# Patient Record
Sex: Male | Born: 1975 | Race: Black or African American | Hispanic: No | Marital: Single | Smoking: Former smoker
Health system: Southern US, Community
[De-identification: ages and names within clinical notes are randomized; demographics above are authoritative.]

---

## 2000-10-30 ENCOUNTER — Emergency Department (HOSPITAL_COMMUNITY): Admission: EM | Admit: 2000-10-30 | Discharge: 2000-10-30 | Payer: Self-pay | Admitting: Emergency Medicine

## 2000-10-30 ENCOUNTER — Encounter: Payer: Self-pay | Admitting: Emergency Medicine

## 2003-12-30 ENCOUNTER — Emergency Department (HOSPITAL_COMMUNITY): Admission: EM | Admit: 2003-12-30 | Discharge: 2003-12-30 | Payer: Self-pay | Admitting: Emergency Medicine

## 2006-04-09 ENCOUNTER — Emergency Department (HOSPITAL_COMMUNITY): Admission: EM | Admit: 2006-04-09 | Discharge: 2006-04-09 | Payer: Self-pay | Admitting: Emergency Medicine

## 2007-08-02 ENCOUNTER — Emergency Department (HOSPITAL_COMMUNITY): Admission: EM | Admit: 2007-08-02 | Discharge: 2007-08-02 | Payer: Self-pay | Admitting: Diagnostic Radiology

## 2008-11-28 ENCOUNTER — Emergency Department (HOSPITAL_COMMUNITY): Admission: EM | Admit: 2008-11-28 | Discharge: 2008-11-28 | Payer: Self-pay | Admitting: Emergency Medicine

## 2008-12-09 ENCOUNTER — Emergency Department (HOSPITAL_BASED_OUTPATIENT_CLINIC_OR_DEPARTMENT_OTHER): Admission: EM | Admit: 2008-12-09 | Discharge: 2008-12-09 | Payer: Self-pay | Admitting: Emergency Medicine

## 2008-12-09 ENCOUNTER — Ambulatory Visit: Payer: Self-pay | Admitting: Radiology
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
## 2012-01-21 ENCOUNTER — Encounter (HOSPITAL_BASED_OUTPATIENT_CLINIC_OR_DEPARTMENT_OTHER): Payer: Self-pay | Admitting: *Deleted

## 2012-01-21 ENCOUNTER — Emergency Department (HOSPITAL_BASED_OUTPATIENT_CLINIC_OR_DEPARTMENT_OTHER)
Admission: EM | Admit: 2012-01-21 | Discharge: 2012-01-21 | Disposition: A | Discharge status: Home / Self Care | Payer: 59 | Attending: Emergency Medicine | Admitting: Emergency Medicine

## 2012-01-21 DIAGNOSIS — K5289 Other specified noninfective gastroenteritis and colitis: Secondary | ICD-10-CM | POA: Insufficient documentation

## 2012-01-21 DIAGNOSIS — R5383 Other fatigue: Secondary | ICD-10-CM | POA: Insufficient documentation

## 2012-01-21 DIAGNOSIS — K529 Noninfective gastroenteritis and colitis, unspecified: Secondary | ICD-10-CM

## 2012-01-21 DIAGNOSIS — R197 Diarrhea, unspecified: Secondary | ICD-10-CM | POA: Insufficient documentation

## 2012-01-21 DIAGNOSIS — R110 Nausea: Secondary | ICD-10-CM | POA: Insufficient documentation

## 2012-01-21 DIAGNOSIS — R5381 Other malaise: Secondary | ICD-10-CM | POA: Insufficient documentation

## 2012-01-21 DIAGNOSIS — F17200 Nicotine dependence, unspecified, uncomplicated: Secondary | ICD-10-CM | POA: Insufficient documentation

## 2012-01-21 LAB — CBC
HCT: 48 % (ref 39.0–52.0)
Hemoglobin: 16.1 g/dL (ref 13.0–17.0)
MCV: 83.7 fL (ref 78.0–100.0)
RBC: 5.02 MIL/uL (ref 4.22–5.81)
WBC: 14.4 10*3/uL — ABNORMAL HIGH (ref 4.0–10.5)

## 2012-01-21 LAB — URINALYSIS, ROUTINE W REFLEX MICROSCOPIC
Glucose, UA: NEGATIVE mg/dL
Hgb urine dipstick: NEGATIVE
Leukocytes, UA: NEGATIVE
Protein, ur: NEGATIVE mg/dL
pH: 5.5 (ref 5.0–8.0)

## 2012-01-21 LAB — DIFFERENTIAL
Eosinophils Relative: 0 % (ref 0–5)
Lymphocytes Relative: 7 % — ABNORMAL LOW (ref 12–46)
Lymphs Abs: 1 10*3/uL (ref 0.7–4.0)
Monocytes Relative: 7 % (ref 3–12)
Neutro Abs: 12.4 10*3/uL — ABNORMAL HIGH (ref 1.7–7.7)

## 2012-01-21 LAB — BASIC METABOLIC PANEL
CO2: 21 mEq/L (ref 19–32)
Calcium: 9.2 mg/dL (ref 8.4–10.5)
Chloride: 101 mEq/L (ref 96–112)
Creatinine, Ser: 1.1 mg/dL (ref 0.50–1.35)
Glucose, Bld: 108 mg/dL — ABNORMAL HIGH (ref 70–99)

## 2012-01-21 MED ORDER — METOCLOPRAMIDE HCL 10 MG PO TABS: 10.0000 mg | ORAL_TABLET | Freq: Four times a day (QID) | ORAL | Status: AC | PRN

## 2012-01-21 MED ORDER — SODIUM CHLORIDE 0.9 % IV BOLUS (SEPSIS)
1000.0000 mL | Freq: Once | INTRAVENOUS | Status: AC
  Administered 2012-01-21: 1000 mL via INTRAVENOUS

## 2012-01-21 MED ORDER — ONDANSETRON HCL 4 MG/2ML IJ SOLN
4.0000 mg | Freq: Once | INTRAMUSCULAR | Status: AC
  Administered 2012-01-21: 4 mg via INTRAVENOUS
  Filled 2012-01-21: qty 2

## 2012-01-21 MED ORDER — LOPERAMIDE HCL 2 MG PO CAPS
4.0000 mg | ORAL_CAPSULE | Freq: Once | ORAL | Status: AC
  Administered 2012-01-21: 4 mg via ORAL
  Filled 2012-01-21: qty 2

## 2012-01-21 MED ORDER — SODIUM CHLORIDE 0.9 % IV SOLN
Freq: Once | INTRAVENOUS | Status: AC
  Administered 2012-01-21: 150 mL/h via INTRAVENOUS

## 2012-01-21 NOTE — ED Notes (Signed)
Received pt. From triage with c/o diarrhea, nausea and weakness, NAD noted

## 2012-01-21 NOTE — ED Provider Notes (Signed)
History     CSN: [id number]  Arrival date & time 01/21/12  2956   First MD Initiated Contact with Patient 01/21/12 2030      Chief Complaint  Patient presents with  . Nausea    (Consider location/radiation/quality/duration/timing/severity/associated sxs/prior treatment) The history is provided by the patient.   36 year old male had onset this morning of diarrhea and nausea. He has not vomited. He has had about 5 watery bowel movements and is still has a sensation of these can have more bowel movements. He had an episode of indigestion this morning but that resolved on its own. He denies abdominal pain. He feels generally weak, but has not had fever chills or sweats. He is unaware of any sick contacts. Last night he had eaten chili dogs and hot wings. Today he is not eaten anything and has only had about 10 ounces of liquid. Symptoms are described as moderate to severe. He did try taking some Pepto-Bismol with no relief.  History reviewed. No pertinent past medical history.  History reviewed. No pertinent past surgical history.  No family history on file.  History  Substance Use Topics  . Smoking status: Current Everyday Smoker -- 1.0 packs/day  . Smokeless tobacco: Not on file  . Alcohol Use: Yes      Review of Systems  All other systems reviewed and are negative.    Allergies  Review of patient's allergies indicates no known allergies.  Home Medications   Current Outpatient Rx  Name Route Sig Dispense Refill  . PEPTO-BISMOL PO Oral Take 1 tablet by mouth daily as needed. Patient used this medication for an upset stomach.      BP 120/70  Pulse 83  Temp(Src) 99.9 F (37.7 C) (Oral)  Resp 16  Wt 220 lb (99.791 kg)  SpO2 98%  Physical Exam  Nursing note and vitals reviewed.  36 year old male who is resting comfortably and in no acute distress. Vital signs are normal. Oxygen saturation is 99% which is normal. Head is normocephalic and atraumatic. PERRLA, EOMI. Her  [place] Guest is clear mucous members are moist. Neck is nontender and supple. Back is nontender. Lungs are clear without rales, wheezes, rhonchi. Heart has regular rate rhythm without murmur. Abdomen is soft, flat, nontender without masses or hepatosplenomegaly. Peristalsis is slightly hyperactive. Extremities have no cyanosis or edema, full range of motion is present. Skin is warm and dry without rash. Neurologic: Mental status is normal, cranial nerves are intact, there no focal motor or sensory deficits.  ED Course  Procedures (including critical care time)  Results for orders placed during the hospital encounter of 01/21/12  CBC      Component Value Range   WBC 14.4 (*) 4.0 - 10.5 (K/uL)   RBC 5.02  4.22 - 5.81 (MIL/uL)   Hemoglobin 16.1  13.0 - 17.0 (g/dL)   HCT 21.3  08.6 - 57.8 (%)   MCV 83.7  78.0 - 100.0 (fL)   MCH 32.1  26.0 - 34.0 (pg)   MCHC 33.5  30.0 - 36.0 (g/dL)   RDW 46.9  62.9 - 52.8 (%)   Platelets 183  150 - 400 (K/uL)  DIFFERENTIAL      Component Value Range   Neutrophils Relative 86 (*) 43 - 77 (%)   Lymphocytes Relative 7 (*) 12 - 46 (%)   Monocytes Relative 7  3 - 12 (%)   Eosinophils Relative 0  0 - 5 (%)   Basophils Relative 0  0 - 1 (%)  Neutro Abs 12.4 (*) 1.7 - 7.7 (K/uL)   Lymphs Abs 1.0  0.7 - 4.0 (K/uL)   Monocytes Absolute 1.0  0.1 - 1.0 (K/uL)   Eosinophils Absolute 0.0  0.0 - 0.7 (K/uL)   Basophils Absolute 0.0  0.0 - 0.1 (K/uL)  BASIC METABOLIC PANEL      Component Value Range   Sodium 134 (*) 135 - 145 (mEq/L)   Potassium 3.7  3.5 - 5.1 (mEq/L)   Chloride 101  96 - 112 (mEq/L)   CO2 21  19 - 32 (mEq/L)   Glucose, Bld 108 (*) 70 - 99 (mg/dL)   BUN 15  6 - 23 (mg/dL)   Creatinine, Ser 1.61  0.50 - 1.35 (mg/dL)   Calcium 9.2  8.4 - 09.6 (mg/dL)   GFR calc non Af Amer 85 (*) >90 (mL/min)   GFR calc Af Amer >90  >90 (mL/min)  URINALYSIS, ROUTINE W REFLEX MICROSCOPIC      Component Value Range   Color, Urine YELLOW  YELLOW    APPearance CLEAR   CLEAR    Specific Gravity, Urine 1.015  1.005 - 1.030    pH 5.5  5.0 - 8.0    Glucose, UA NEGATIVE  NEGATIVE (mg/dL)   Hgb urine dipstick NEGATIVE  NEGATIVE    Bilirubin Urine NEGATIVE  NEGATIVE    Ketones, ur NEGATIVE  NEGATIVE (mg/dL)   Protein, ur NEGATIVE  NEGATIVE (mg/dL)   Urobilinogen, UA 0.2  0.0 - 1.0 (mg/dL)   Nitrite NEGATIVE  NEGATIVE    Leukocytes, UA NEGATIVE  NEGATIVE    He was given IV fluids, IV ondansetron, and oral loperamide with good relief of symptoms. He'll be sent home with a prescription for oral metoclopramide and told to take over-the-counter loperamide as needed.  1. Gastroenteritis       MDM  Nausea and diarrhea which could be due to you to poisoning and could be do to a viral infection. He'll be given IV fluids, IV Zofran and oral loperamide.        Dione Booze, MD 01/21/12 [phone]

## 2012-01-21 NOTE — ED Notes (Signed)
Pt. Discharged to home, pt. Alert and oriented, NAD noted, ambulatory gait steady

## 2012-01-21 NOTE — Discharge Instructions (Signed)
Take Imodium AD as needed for diarrhea.  Diarrhea Infections caused by germs (bacterial) or a virus commonly cause diarrhea. Your caregiver has determined that with time, rest and fluids, the diarrhea should improve. In general, eat normally while drinking more water than usual. Although water may prevent dehydration, it does not contain salt and minerals (electrolytes). Broths, weak tea without caffeine and oral rehydration solutions (ORS) replace fluids and electrolytes. Small amounts of fluids should be taken frequently. Large amounts at one time may not be tolerated. Plain water may be harmful in infants and the elderly. Oral rehydrating solutions (ORS) are available at pharmacies and grocery stores. ORS replace water and important electrolytes in proper proportions. Sports drinks are not as effective as ORS and may be harmful due to sugars worsening diarrhea.  ORS is especially recommended for use in children with diarrhea. As a general guideline for children, replace any new fluid losses from diarrhea and/or vomiting with ORS as follows:   If your child weighs 22 pounds or under (10 kg or less), give 60-120 mL ( -  cup or 2 - 4 ounces) of ORS for each episode of diarrheal stool or vomiting episode.   If your child weighs more than 22 pounds (more than 10 kgs), give 120-240 mL ( - 1 cup or 4 - 8 ounces) of ORS for each diarrheal stool or episode of vomiting.   While correcting for dehydration, children should eat normally. However, foods high in sugar should be avoided because this may worsen diarrhea. Large amounts of carbonated soft drinks, juice, gelatin desserts and other highly sugared drinks should be avoided.   After correction of dehydration, other liquids that are appealing to the child may be added. Children should drink small amounts of fluids frequently and fluids should be increased as tolerated. Children should drink enough fluids to keep urine clear or pale yellow.   Adults  should eat normally while drinking more fluids than usual. Drink small amounts of fluids frequently and increase as tolerated. Drink enough fluids to keep urine clear or pale yellow. Broths, weak decaffeinated tea, lemon lime soft drinks (allowed to go flat) and ORS replace fluids and electrolytes.   Avoid:   Carbonated drinks.   Juice.   Extremely hot or cold fluids.   Caffeine drinks.   Fatty, greasy foods.   Alcohol.   Tobacco.   Too much intake of anything at one time.   Gelatin desserts.   Probiotics are active cultures of beneficial bacteria. They may lessen the amount and number of diarrheal stools in adults. Probiotics can be found in yogurt with active cultures and in supplements.   Wash hands well to avoid spreading bacteria and virus.   Anti-diarrheal medications are not recommended for infants and children.   Only take over-the-counter or prescription medicines for pain, discomfort or fever as directed by your caregiver. Do not give aspirin to children because it may cause Reye's Syndrome.   For adults, ask your caregiver if you should continue all prescribed and over-the-counter medicines.   If your caregiver has given you a follow-up appointment, it is very important to keep that appointment. Not keeping the appointment could result in a chronic or permanent injury, and disability. If there is any problem keeping the appointment, you must call back to this facility for assistance.  SEEK IMMEDIATE MEDICAL CARE IF:   You or your child is unable to keep fluids down or other symptoms or problems become worse in spite of treatment.  Vomiting or diarrhea develops and becomes persistent.   There is vomiting of blood or bile (green material).   There is blood in the stool or the stools are black and tarry.   There is no urine output in 6-8 hours or there is only a small amount of very dark urine.   Abdominal pain develops, increases or localizes.   You have a  fever.   Your baby is older than 3 months with a rectal temperature of 102 F (38.9 C) or higher.   Your baby is 42 months old or younger with a rectal temperature of 100.4 F (38 C) or higher.   You or your child develops excessive weakness, dizziness, fainting or extreme thirst.   You or your child develops a rash, stiff neck, severe headache or become irritable or sleepy and difficult to awaken.  MAKE SURE YOU:   Understand these instructions.   Will watch your condition.   Will get help right away if you are not doing well or get worse.  Document Released: 10/22/2002 Document Revised: 10/21/2011 Document Reviewed: 09/08/2009 [facility] Patient Information 2012 [place], [place].  Nausea, Adult Nausea is the feeling that you have an upset stomach or have to vomit. Nausea by itself is not likely a serious concern, but it may be an early sign of more serious medical problems. As nausea gets worse, it can lead to vomiting. If vomiting develops, there is the risk of dehydration.  CAUSES   Viral infections.   Food poisoning.   Medicines.   Pregnancy.   Motion sickness.   Migraine headaches.   Emotional distress.   Severe pain from any source.   Alcohol intoxication.  HOME CARE INSTRUCTIONS  Get plenty of rest.   Ask your caregiver about specific rehydration instructions.   Eat small amounts of food and sip liquids more often.   Take all medicines as told by your caregiver.  SEEK MEDICAL CARE IF:  You have not improved after 2 days, or you get worse.   You have a headache.  SEEK IMMEDIATE MEDICAL CARE IF:   You have a fever.   You faint.   You keep vomiting or have blood in your vomit.   You are extremely weak or dehydrated.   You have dark or bloody stools.   You have severe chest or abdominal pain.  MAKE SURE YOU:  Understand these instructions.   Will watch your condition.   Will get help right away if you are not doing well or get worse.    Document Released: 12/09/2004 Document Revised: 10/21/2011 Document Reviewed: 07/14/2011 [facility] Patient Information 2012 [place], [place].  Metoclopramide tablets What is this medicine? METOCLOPRAMIDE (met oh kloe PRA mide) is used to treat the symptoms of gastroesophageal reflux disease (GERD) like heartburn. It is also used to treat people with slow emptying of the stomach and intestinal tract. This medicine may be used for other purposes; ask your health care provider or pharmacist if you have questions. What should I tell my health care provider before I take this medicine? They need to know if you have any of these conditions: -breast cancer -depression -diabetes -heart failure -high blood pressure -kidney disease -liver disease -Parkinson's disease or a movement disorder -pheochromocytoma -seizures -stomach obstruction, bleeding, or perforation -an unusual or allergic reaction to metoclopramide, procainamide, sulfites, other medicines, foods, dyes, or preservatives -pregnant or trying to get pregnant -breast-feeding How should I use this medicine? Take this medicine by mouth with a glass of water.  Follow the directions on the prescription label. Take this medicine on an empty stomach, about 30 minutes before eating. Take your doses at regular intervals. Do not take your medicine more often than directed. Do not stop taking except on the advice of your doctor or health care professional. A special MedGuide will be given to you by the pharmacist with each prescription and refill. Be sure to read this information carefully each time. Talk to your pediatrician regarding the use of this medicine in children. Special care may be needed. Overdosage: If you think you have taken too much of this medicine contact a poison control center or emergency room at once. NOTE: This medicine is only for you. Do not share this medicine with others. What if I miss a dose? If you miss a dose, take  it as soon as you can. If it is almost time for your next dose, take only that dose. Do not take double or extra doses. What may interact with this medicine? -acetaminophen -cyclosporine -digoxin -medicines for blood pressure -medicines for diabetes, including insulin -medicines for hay fever and other allergies -medicines for depression, especially an Monoamine Oxidase Inhibitor (MAOI) -medicines for Parkinson's disease, like levodopa -medicines for sleep or for pain -tetracycline This list may not describe all possible interactions. Give your health care provider a list of all the medicines, herbs, non-prescription drugs, or dietary supplements you use. Also tell them if you smoke, drink alcohol, or use illegal drugs. Some items may interact with your medicine. What should I watch for while using this medicine? It may take a few weeks for your stomach condition to start to get better. However, do not take this medicine for longer than 12 weeks. The longer you take this medicine, and the more you take it, the greater your chances are of developing serious side effects. If you are an elderly patient, a male patient, or you have diabetes, you may be at an increased risk for side effects from this medicine. Contact your doctor immediately if you start having movements you cannot control such as lip smacking, rapid movements of the tongue, involuntary or uncontrollable movements of the eyes, head, arms and legs, or muscle twitches and spasms. Patients and their families should watch out for worsening depression or thoughts of suicide. Also watch out for any sudden or severe changes in feelings such as feeling anxious, agitated, panicky, irritable, hostile, aggressive, impulsive, severely restless, overly excited and hyperactive, or not being able to sleep. If this happens, especially at the beginning of treatment or after a change in dose, call your doctor. Do not treat yourself for high fever. Ask  your doctor or health care professional for advice. You may get drowsy or dizzy. Do not drive, use machinery, or do anything that needs mental alertness until you know how this drug affects you. Do not stand or sit up quickly, especially if you are an older patient. This reduces the risk of dizzy or fainting spells. Alcohol can make you more drowsy and dizzy. Avoid alcoholic drinks. What side effects may I notice from receiving this medicine? Side effects that you should report to your doctor or health care professional as soon as possible: -allergic reactions like skin rash, itching or hives, swelling of the face, lips, or tongue -abnormal production of milk in females -breast enlargement in both males and females -change in the way you walk -difficulty moving, speaking or swallowing -drooling, lip smacking, or rapid movements of the tongue -excessive sweating -fever -involuntary  or uncontrollable movements of the eyes, head, arms and legs -irregular heartbeat or palpitations -muscle twitches and spasms -unusually weak or tired Side effects that usually do not require medical attention (report to your doctor or health care professional if they continue or are bothersome): -change in sex drive or performance -depressed mood -diarrhea -difficulty sleeping -headache -menstrual changes -restless or nervous This list may not describe all possible side effects. Call your doctor for medical advice about side effects. You may report side effects to FDA at 1-800-FDA-1088. Where should I keep my medicine? Keep out of the reach of children. Store at room temperature between 20 and 25 degrees C (68 and 77 degrees F). Protect from light. Keep container tightly closed. Throw away any unused medicine after the expiration date. NOTE: This sheet is a summary. It may not cover all possible information. If you have questions about this medicine, talk to your doctor, pharmacist, or health care provider.   2012, [employer]. (06/26/2008 4:30:05 PM)

## 2012-01-21 NOTE — ED Notes (Signed)
Nausea, abdominal pain, diarrhea, chills and weakness since this am.

## 2013-03-28 ENCOUNTER — Encounter (HOSPITAL_COMMUNITY): Payer: Self-pay | Admitting: *Deleted

## 2013-03-28 ENCOUNTER — Emergency Department (HOSPITAL_COMMUNITY)
Admission: EM | Admit: 2013-03-28 | Discharge: 2013-03-28 | Disposition: A | Discharge status: Home / Self Care | Payer: 59 | Source: Home / Self Care | Attending: Family Medicine | Admitting: Family Medicine

## 2013-03-28 DIAGNOSIS — B349 Viral infection, unspecified: Secondary | ICD-10-CM

## 2013-03-28 DIAGNOSIS — J40 Bronchitis, not specified as acute or chronic: Secondary | ICD-10-CM

## 2013-03-28 DIAGNOSIS — B9789 Other viral agents as the cause of diseases classified elsewhere: Secondary | ICD-10-CM

## 2013-03-28 MED ORDER — IBUPROFEN 600 MG PO TABS: 600.0000 mg | ORAL_TABLET | Freq: Three times a day (TID) | ORAL | Status: DC | PRN

## 2013-03-28 MED ORDER — LOPERAMIDE HCL 2 MG PO CAPS: 2.0000 mg | ORAL_CAPSULE | Freq: Four times a day (QID) | ORAL | Status: DC | PRN

## 2013-03-28 MED ORDER — BENZONATATE 100 MG PO CAPS: 100.0000 mg | ORAL_CAPSULE | Freq: Three times a day (TID) | ORAL | Status: DC

## 2013-03-28 MED ORDER — ALBUTEROL SULFATE HFA 108 (90 BASE) MCG/ACT IN AERS: 1.0000 | INHALATION_SPRAY | Freq: Four times a day (QID) | RESPIRATORY_TRACT | Status: DC | PRN

## 2013-03-28 MED ORDER — PREDNISONE 20 MG PO TABS: ORAL_TABLET | ORAL | Status: DC

## 2013-03-28 MED ORDER — ALBUTEROL SULFATE HFA 108 (90 BASE) MCG/ACT IN AERS
1.0000 | INHALATION_SPRAY | Freq: Four times a day (QID) | RESPIRATORY_TRACT | Status: DC | PRN
Start: 2013-03-28 — End: 2013-03-28

## 2013-03-28 NOTE — ED Provider Notes (Signed)
History     CSN: [id number]  Arrival date & time 03/28/13  1404   First MD Initiated Contact with Patient 03/28/13 1528      No chief complaint on file.   (Consider location/radiation/quality/duration/timing/severity/associated sxs/prior treatment) HPI Comments: 37 year old male smoker. Here complaining of nasal congestion and cough spells with clear sputum for 2-3 days. Symptoms have been associated with chills but denies fever. Has had episodes of wheezing and reports pain in the Center of his chest only with coughing. Throat is starting to hurt. Denies shortness of breath or pleuritic chest pain. Appetite is good. Denies abdominal pain but has had episodes of loose stools since yesterday. Taking Mucinex.   No past medical history on file.  No past surgical history on file.  No family history on file.  History  Substance Use Topics  . Smoking status: Current Every Day Smoker -- 1.00 packs/day  . Smokeless tobacco: Not on file  . Alcohol Use: Yes      Review of Systems  Constitutional: Positive for chills. Negative for fever, diaphoresis, appetite change and fatigue.  HENT: Positive for congestion and sore throat.   Respiratory: Positive for cough and wheezing. Negative for shortness of breath.   Gastrointestinal: Positive for diarrhea. Negative for nausea, vomiting and abdominal pain.  Skin: Negative for rash.  Neurological: Negative for dizziness and headaches.  All other systems reviewed and are negative.    Allergies  Review of patient's allergies indicates no known allergies.  Home Medications   Current Outpatient Rx  Name  Route  Sig  Dispense  Refill  . albuterol (PROVENTIL HFA;VENTOLIN HFA) 108 (90 BASE) MCG/ACT inhaler   Inhalation   Inhale 1-2 puffs into the lungs every 6 (six) hours as needed for wheezing.   1 Inhaler   0   . benzonatate (TESSALON) 100 MG capsule   Oral   Take 1 capsule (100 mg total) by mouth every 8 (eight) hours.   21 capsule   0   . Bismuth Subsalicylate (PEPTO-BISMOL PO)   Oral   Take 1 tablet by mouth daily as needed. Patient used this medication for an upset stomach.         Marland Kitchen ibuprofen (ADVIL,MOTRIN) 600 MG tablet   Oral   Take 1 tablet (600 mg total) by mouth every 8 (eight) hours as needed for pain or fever.   20 tablet   0     Take with food   . loperamide (IMODIUM) 2 MG capsule   Oral   Take 1 capsule (2 mg total) by mouth 4 (four) times daily as needed for diarrhea or loose stools.   12 capsule   0   . predniSONE (DELTASONE) 20 MG tablet      2 tabs po daily for 5 days   10 tablet   0     BP 118/71  Pulse 92  Temp(Src) 99.3 F (37.4 C) (Temporal)  Resp 16  SpO2 99%  Physical Exam  Nursing note and vitals reviewed. Constitutional: He is oriented to person, place, and time. He appears well-developed and well-nourished. No distress.  HENT:  Head: Normocephalic and atraumatic.  Nasal Congestion with erythema and swelling of nasal turbinates, clear rhinorrhea. pharyngeal erythema no exudates. No uvula deviation. No trismus. TM's normal  Eyes: Conjunctivae are normal. No scleral icterus.  Neck: Neck supple. No thyromegaly present.  Cardiovascular: Normal rate, regular rhythm and normal heart sounds.   Pulmonary/Chest: Effort normal and breath sounds normal.  Sporadic bilateral  expiratory rhonchi. No wheezing  Abdominal: Soft. Bowel sounds are normal. He exhibits no distension and no mass. There is no tenderness. There is no rebound and no guarding.  Lymphadenopathy:    He has no cervical adenopathy.  Neurological: He is alert and oriented to person, place, and time.  Skin: No rash noted. He is not diaphoretic.    ED Course  Procedures (including critical care time)  Labs Reviewed - No data to display No results found.   1. Viral syndrome   2. Bronchitis       MDM  Impress viral respiratory infection likely triggering bronchitis in this smoker male. Prescribed  prednisone, albuterol, loperamide, ibuprofen and Tessalon Perles. Supportive care and red flags that should prompt his return to medical attention discussed with patient and provided in writing.         Sharin Grave, MD 03/28/13 [phone]

## 2013-03-28 NOTE — ED Notes (Signed)
Had diarrhea last night about 5 times and it was watery and once this AM.  C/o coughing onset Monday night.  Throat hurts when he coughs.  No runny nose, earache, chills or fever.

## 2013-03-31 ENCOUNTER — Emergency Department (INDEPENDENT_AMBULATORY_CARE_PROVIDER_SITE_OTHER)
Admission: EM | Admit: 2013-03-31 | Discharge: 2013-03-31 | Disposition: A | Discharge status: Home / Self Care | Payer: 59 | Source: Home / Self Care | Attending: Family Medicine | Admitting: Family Medicine

## 2013-03-31 ENCOUNTER — Emergency Department (INDEPENDENT_AMBULATORY_CARE_PROVIDER_SITE_OTHER): Payer: 59

## 2013-03-31 ENCOUNTER — Encounter (HOSPITAL_COMMUNITY): Payer: Self-pay | Admitting: Emergency Medicine

## 2013-03-31 DIAGNOSIS — J40 Bronchitis, not specified as acute or chronic: Secondary | ICD-10-CM

## 2013-03-31 MED ORDER — LEVOFLOXACIN 500 MG PO TABS: 750.0000 mg | ORAL_TABLET | Freq: Every day | ORAL | Status: DC

## 2013-03-31 MED ORDER — AZITHROMYCIN 250 MG PO TABS: ORAL_TABLET | ORAL | Status: DC

## 2013-03-31 NOTE — ED Provider Notes (Signed)
History     CSN: [id number]  Arrival date & time 03/31/13  1314   First MD Initiated Contact with Patient 03/31/13 1453      Chief Complaint  Patient presents with  . Follow-up    follow up to 5/14 visit. pt is now c/o night sweats. head congestion. soreness in chest from coughing and noticed blood in sputum.     HPI: Patient is a 37 y.o. male presenting with cough. The history is provided by the patient.  Cough Cough characteristics:  Harsh Severity:  Moderate Onset quality:  Gradual Duration:  5 days Timing:  Intermittent Progression:  Unchanged Chronicity:  New Smoker: yes   Context: upper respiratory infection   Relieved by:  Beta-agonist inhaler Associated symptoms: chills, diaphoresis and sinus congestion   Associated symptoms: no chest pain, no ear pain, no fever, no rhinorrhea, no shortness of breath and no sore throat   Pt reports persistent cough x 2 weeks. Initially started around 03/26/13 w/ frequent dry cough and diarrhea. Was seen here on 03/28/13 and treated for Bronchitis and diarrhea. Pt reports diarrhea has since resolved. Since that visist cough has persisted. Denies known fevers but now reports he is having night sweats and blood tinged sputum. Pt is inquiring about needing an antibiotic to help get rid of his cough. Pt is a smoker. No other associated sx's at this time.  History reviewed. No pertinent past medical history.  History reviewed. No pertinent past surgical history.  Family History  Problem Relation Age of Onset  . Seizures Father   . Alcoholism Father     History  Substance Use Topics  . Smoking status: Current Every Day Smoker -- 1.00 packs/day  . Smokeless tobacco: Not on file  . Alcohol Use: Yes     Comment: occasionally      Review of Systems  Constitutional: Positive for chills and diaphoresis. Negative for fever.  HENT: Negative for ear pain, congestion, sore throat, rhinorrhea, trouble swallowing, voice change, postnasal drip  and sinus pressure.   Eyes: Negative.   Respiratory: Positive for cough. Negative for shortness of breath.   Cardiovascular: Negative for chest pain.  Gastrointestinal: Negative.   Endocrine: Negative.   Genitourinary: Negative.   Musculoskeletal: Negative.   Skin: Negative.   Allergic/Immunologic: Negative.   Neurological: Negative.   Hematological: Negative.   Psychiatric/Behavioral: Negative.     Allergies  Review of patient's allergies indicates no known allergies.  Home Medications   Current Outpatient Rx  Name  Route  Sig  Dispense  Refill  . benzonatate (TESSALON) 100 MG capsule   Oral   Take 1 capsule (100 mg total) by mouth every 8 (eight) hours.   21 capsule   0   . guaiFENesin (MUCINEX) 600 MG 12 hr tablet   Oral   Take 1,200 mg by mouth 2 (two) times daily.         Marland Kitchen ibuprofen (ADVIL,MOTRIN) 600 MG tablet   Oral   Take 1 tablet (600 mg total) by mouth every 8 (eight) hours as needed for pain or fever.   20 tablet   0     Take with food   . predniSONE (DELTASONE) 20 MG tablet      2 tabs po daily for 5 days   10 tablet   0   . albuterol (PROVENTIL HFA;VENTOLIN HFA) 108 (90 BASE) MCG/ACT inhaler   Inhalation   Inhale 1-2 puffs into the lungs every 6 (six) hours as needed for  wheezing.   1 Inhaler   0   . Bismuth Subsalicylate (PEPTO-BISMOL PO)   Oral   Take 1 tablet by mouth daily as needed. Patient used this medication for an upset stomach.         . loperamide (IMODIUM) 2 MG capsule   Oral   Take 1 capsule (2 mg total) by mouth 4 (four) times daily as needed for diarrhea or loose stools.   12 capsule   0     BP 128/82  Pulse 60  Temp(Src) 98.4 F (36.9 C) (Oral)  Resp 14  SpO2 100%  Physical Exam  Constitutional: He is oriented to person, place, and time. He appears well-developed and well-nourished. He does not have a sickly appearance. No distress.  Eyes: Conjunctivae are normal.  Neck: Neck supple.  Cardiovascular: Normal  rate and regular rhythm.   Pulmonary/Chest: Effort normal and breath sounds normal.  Occasional dry cough w/ inspiration.  Musculoskeletal: Normal range of motion.  Neurological: He is alert and oriented to person, place, and time.  Skin: Skin is warm and dry.  Psychiatric: He has a normal mood and affect.    ED Course  Procedures (including critical care time)  Labs Reviewed - No data to display No results found.   No diagnosis found.    MDM  Persistent cough x 2 weeks. Seen here 03/28/13 and treated w/ Prednisone, Tessalon Pearles and Imodium. Cough has persisted and now associated w/ night sweats and blood tinged sputum. CXR reveals "Peribronchial thickening which may relate to chronic bronchitis or smoking. Favor vascular crowding and volume loss in the right infrahilar region. Short-term radiographic follow-up should be considered to exclude less likely early right infrahilar pneumonia". Results of CXR discussed w/ pt. Will treat w/ Levaquin and encourage pt to arrange f/u for repeat CXR in 2 weeks. Pt agreeable. Discussed pt w/ Dr Artis Flock who is in agreement w/ plan.           Leanne Chang, NP 04/03/13 [phone]

## 2013-03-31 NOTE — ED Notes (Signed)
Waiting discharge papers 

## 2013-03-31 NOTE — ED Notes (Signed)
Pt following up to 5/14 visit. Pt is still having chest soreness from cough and now has had an episode of blood tinged sputum.  Head congestion. Night sweats and low grade temp. Pt denies n/v/d.

## 2013-04-03 NOTE — ED Provider Notes (Signed)
Medical screening examination/treatment/procedure(s) were performed by resident physician or non-physician practitioner and as supervising physician I was immediately available for consultation/collaboration.   Reem Fleury DOUGLAS MD.   Fausto Sampedro D Marca Gadsby, MD 04/03/13 1906 

## 2013-10-15 IMAGING — CR DG CHEST 2V
2 series · 2 of 2 positions shown · non-contrast
Comparison: None.

CLINICAL DATA: Hemoptysis

CHEST - 2 VIEW

[view not recorded (1 of 2)]
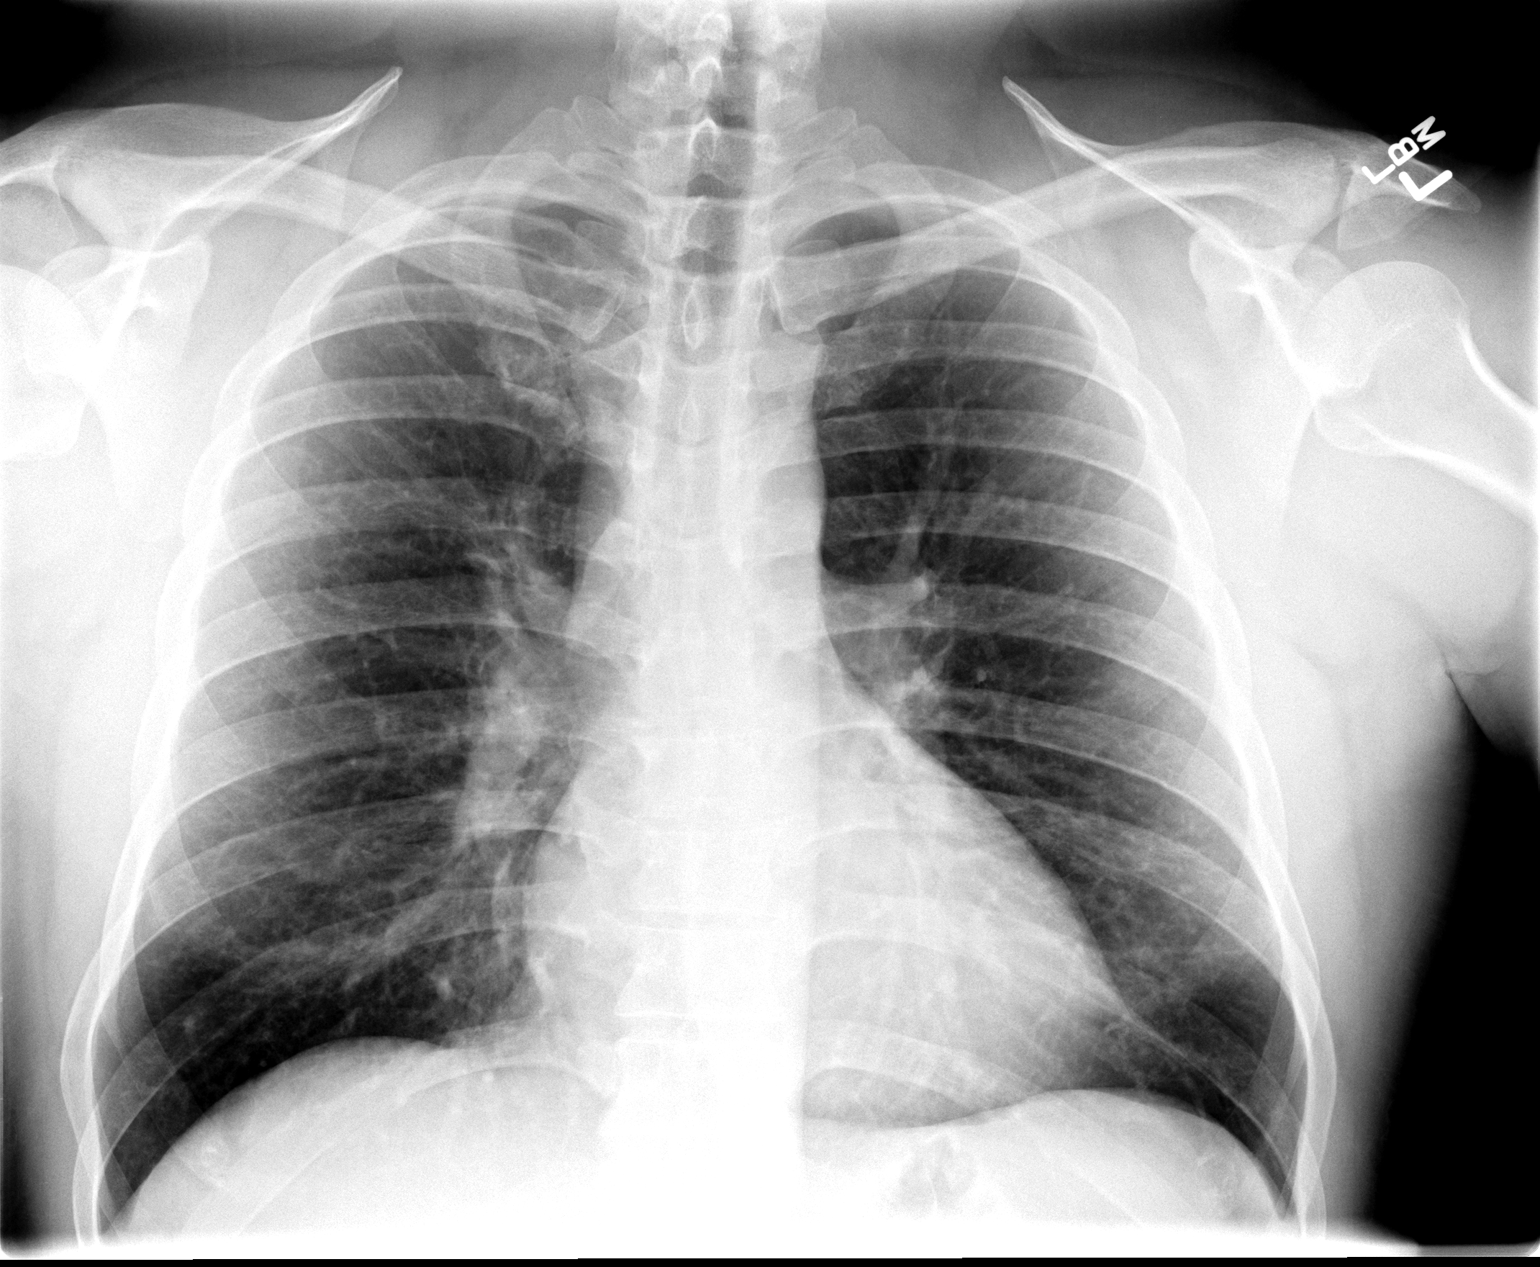

[view not recorded (2 of 2)]
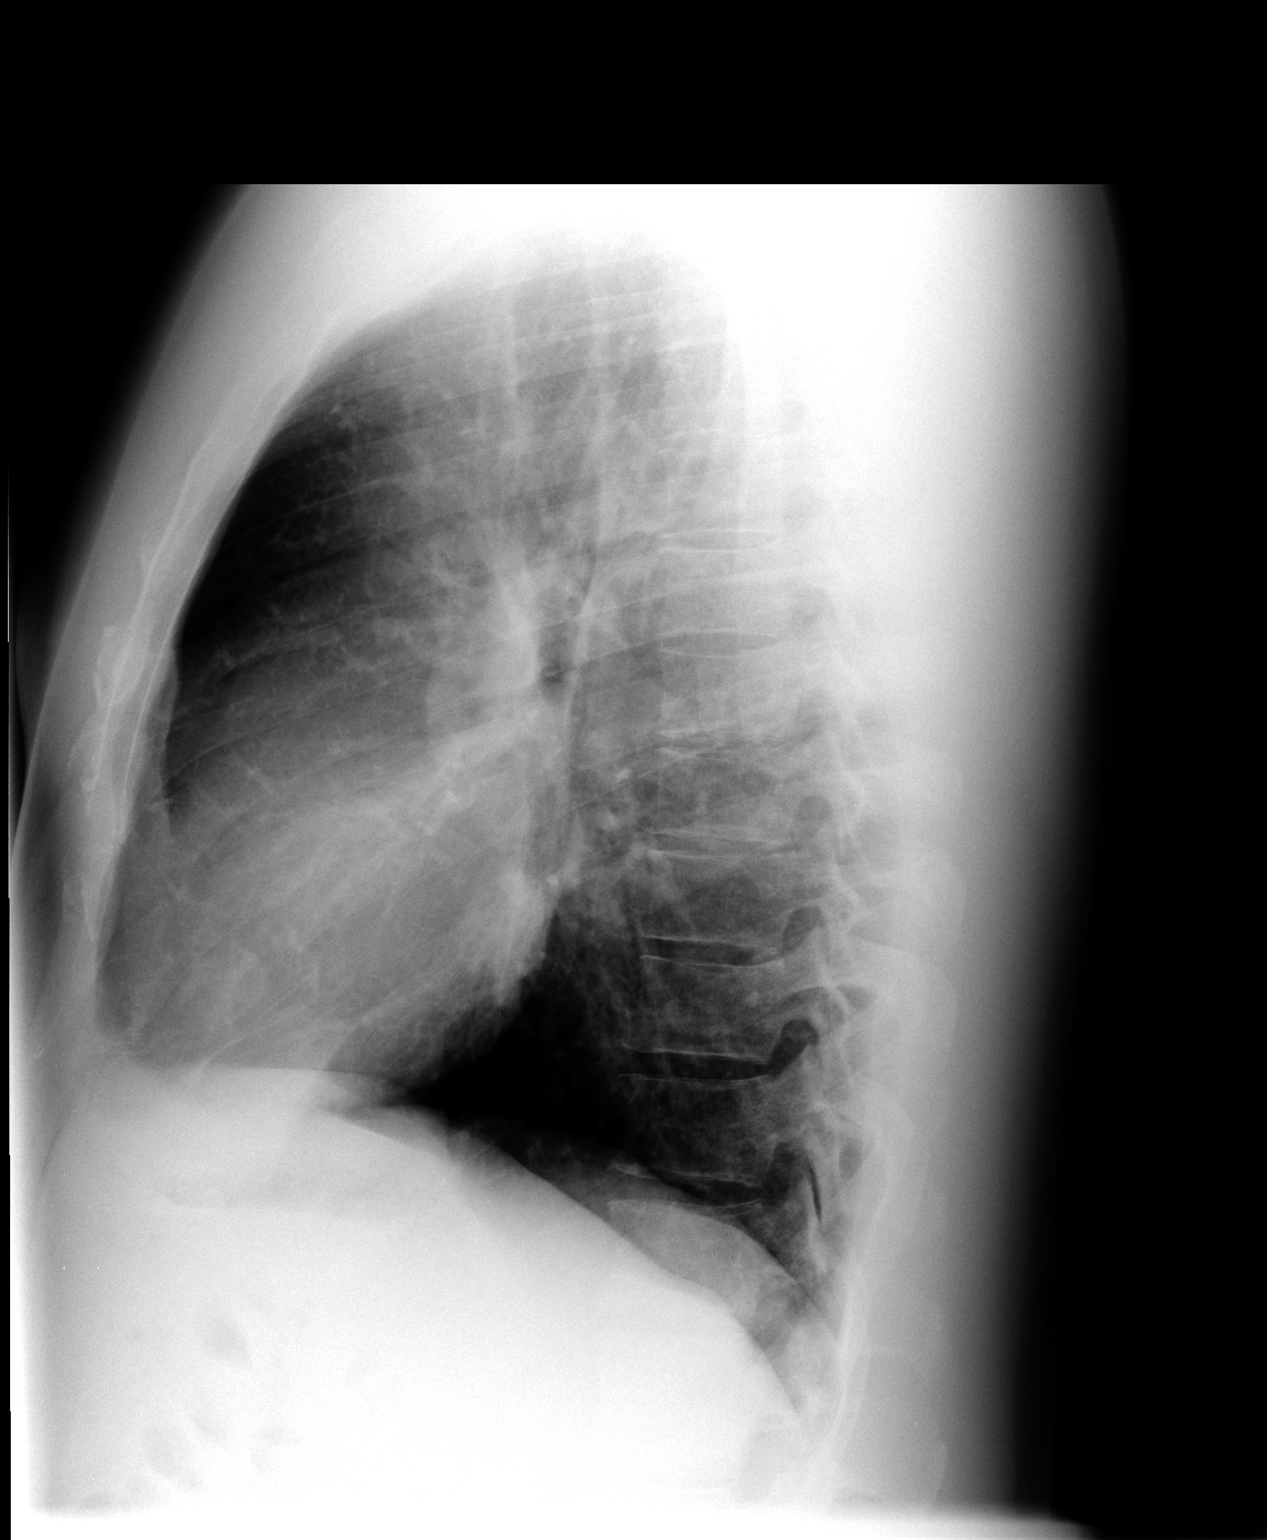

[2 of 2 positions shown; findings below may reference images not displayed]

FINDINGS: Midline trachea.  Normal heart size and mediastinal
contours. No pleural effusion or pneumothorax.  Diffuse
peribronchial thickening.  Increased density in the right
infrahilar region is favored to be due to mild volume loss and
vascular crowding.
IMPRESSION: No acute cardiopulmonary disease.

Peribronchial thickening which may relate to chronic bronchitis or
smoking.

Favor vascular crowding and volume loss in the right infrahilar
region.  Short-term radiographic follow-up should be considered to
exclude less likely early right infrahilar pneumonia.

## 2014-12-22 ENCOUNTER — Ambulatory Visit (INDEPENDENT_AMBULATORY_CARE_PROVIDER_SITE_OTHER): Payer: 59 | Admitting: Family Medicine

## 2014-12-22 VITALS — BP 94/60 | HR 72 | Temp 98.5°F | Resp 18 | Ht 69.0 in | Wt 218.0 lb

## 2014-12-22 DIAGNOSIS — R059 Cough, unspecified: Secondary | ICD-10-CM

## 2014-12-22 DIAGNOSIS — J9801 Acute bronchospasm: Secondary | ICD-10-CM

## 2014-12-22 DIAGNOSIS — L02419 Cutaneous abscess of limb, unspecified: Secondary | ICD-10-CM

## 2014-12-22 DIAGNOSIS — R05 Cough: Secondary | ICD-10-CM

## 2014-12-22 DIAGNOSIS — L03119 Cellulitis of unspecified part of limb: Secondary | ICD-10-CM

## 2014-12-22 MED ORDER — DOXYCYCLINE HYCLATE 100 MG PO TABS: 100.0000 mg | ORAL_TABLET | Freq: Two times a day (BID) | ORAL | Status: DC

## 2014-12-22 MED ORDER — ALBUTEROL SULFATE HFA 108 (90 BASE) MCG/ACT IN AERS: 1.0000 | INHALATION_SPRAY | Freq: Four times a day (QID) | RESPIRATORY_TRACT | Status: DC | PRN

## 2014-12-22 NOTE — Progress Notes (Signed)
Subjective:    Patient ID: Tyler Hines, male    DOB: 1976-02-05, 39 y.o.   MRN: [id number] This chart was scribed for Meredith Staggers, MD by Jolene Provost, Medical Scribe. This patient was seen in Room 1 and the patient's care was started a 9:49 AM.   Chief Complaint  Patient presents with  . Cough    productive cough, been taking mucinex, yellow phlegm since monday into tuesday  . Fever    mainly at night  . Rash    got bit by something at work x 2 days ago    HPI There are no active problems to display for this patient.  History reviewed. No pertinent past medical history. History reviewed. No pertinent past surgical history. No Known Allergies Prior to Admission medications   Medication Sig Start Date End Date Taking? Authorizing Provider  guaiFENesin (MUCINEX) 600 MG 12 hr tablet Take 1,200 mg by mouth 2 (two) times daily.   Yes Historical Provider, MD  albuterol (PROVENTIL HFA;VENTOLIN HFA) 108 (90 BASE) MCG/ACT inhaler Inhale 1-2 puffs into the lungs every 6 (six) hours as needed for wheezing. Patient not taking: Reported on 12/22/2014 03/28/13   Christin Fudge Moreno-Coll, MD  benzonatate (TESSALON) 100 MG capsule Take 1 capsule (100 mg total) by mouth every 8 (eight) hours. Patient not taking: Reported on 12/22/2014 03/28/13   Christin Fudge Moreno-Coll, MD  Bismuth Subsalicylate (PEPTO-BISMOL PO) Take 1 tablet by mouth daily as needed. Patient used this medication for an upset stomach. 03/28/13   Adlih Moreno-Coll, MD  ibuprofen (ADVIL,MOTRIN) 600 MG tablet Take 1 tablet (600 mg total) by mouth every 8 (eight) hours as needed for pain or fever. Patient not taking: Reported on 12/22/2014 03/28/13   Christin Fudge Moreno-Coll, MD  levofloxacin (LEVAQUIN) 500 MG tablet Take 1.5 tablets (750 mg total) by mouth daily. Patient not taking: Reported on 12/22/2014 03/31/13   Roma Kayser Schorr, NP  loperamide (IMODIUM) 2 MG capsule Take 1 capsule (2 mg total) by mouth 4 (four) times daily as needed for diarrhea or  loose stools. Patient not taking: Reported on 12/22/2014 03/28/13   Sharin Grave, MD  predniSONE (DELTASONE) 20 MG tablet 2 tabs po daily for 5 days Patient not taking: Reported on 12/22/2014 03/28/13   Sharin Grave, MD   History   Social History  . Marital Status: Single    Spouse Name: N/A    Number of Children: N/A  . Years of Education: N/A   Occupational History  . Not on file.   Social History Main Topics  . Smoking status: Current Every Day Smoker -- 1.00 packs/day  . Smokeless tobacco: Never Used  . Alcohol Use: 0.0 oz/week    0 Not specified per week     Comment: occasionally  . Drug Use: No  . Sexual Activity: Yes   Other Topics Concern  . Not on file   Social History Narrative     HPI Comments: Tyler Hines is a 39 y.o. male who presents to [facility] complaining of a cough productive of yellow sputum that started two days ago. Pt endorses associated subjective fever (at night), diaphoresis, and occasional rhinorrhea . Pt denies sick contacts or sneezing. Pt states he has taken mucinex and aleve without relief.  Pt also states he has a rash on his right lower leg that started four days ago. Pt states he believes he was bitten by something two days ago and has since developed a swollen spot on his right lower leg  with a white head on that site.     Review of Systems  Constitutional: Positive for fever and chills.  HENT: Positive for congestion.   Respiratory: Positive for cough.   Skin: Positive for rash.      Objective:   Physical Exam  Constitutional: He is oriented to person, place, and time. He appears well-developed and well-nourished.  HENT:  Head: Normocephalic and atraumatic.  Clear to slightly yellow, nasal discharge with edematous nasal turbinates.   Eyes: EOM are normal. Pupils are equal, round, and reactive to light.  Neck: Neck supple.  Cardiovascular: Normal rate, regular rhythm and normal heart sounds.   No murmur  heard. Pulmonary/Chest: Effort normal. No respiratory distress. He has wheezes.  Very faint end expiratory wheeze on few breaths only, lower lung fields.  Neurological: He is alert and oriented to person, place, and time.  Skin: Skin is warm and dry.  Right lateral lower leg, erythematous circular path 2.5 cm with a central white to yellow pustule measuring 5mm. indurated without central fluctuance.   Psychiatric: He has a normal mood and affect. His behavior is normal.  Nursing note and vitals reviewed.   Filed Vitals:   12/22/14 0931  BP: 94/60  Pulse: 72  Temp: 98.5 F (36.9 C)  TempSrc: Oral  Resp: 18  Height:  (1.753 m)  Weight: 218 lb (98.884 kg)  SpO2: 97%       Assessment & Plan:   Tyler Hines is a 39 y.o. male Cellulitis and abscess of leg - Plan: Wound culture, doxycycline (VIBRA-TABS) 100 MG tablet  -start doxycycline BID as has appearance of MRSA, doubt spider bite, warm compresses and pressure to express pus, and recheck in 3-4 days if not improving. Sooner if worse.   Cough, Bronchospasm - Plan: albuterol (PROVENTIL HFA;VENTOLIN HFA) 108 (90 BASE) MCG/ACT inhaler   -viral URI with secondary cough and bronchospasm. Mild. Sx care with albuterol if needed, rtc precautions.    Meds ordered this encounter  Medications  . albuterol (PROVENTIL HFA;VENTOLIN HFA) 108 (90 BASE) MCG/ACT inhaler    Sig: Inhale 1-2 puffs into the lungs every 6 (six) hours as needed for wheezing.    Dispense:  1 Inhaler    Refill:  0  . doxycycline (VIBRA-TABS) 100 MG tablet    Sig: Take 1 tablet (100 mg total) by mouth 2 (two) times daily.    Dispense:  20 tablet    Refill:  0   Patient Instructions  Start doxycycline twice per day for leg infection and cough.  Albuterol if needed for wheezing.  Hot compresses to leg wound 4-5 times per day, and recheck in next 3 days if not improving. Return to the clinic or go to the nearest emergency room if any of your symptoms worsen or  new symptoms occur. Bronchospasm A bronchospasm is a spasm or tightening of the airways going into the lungs. During a bronchospasm breathing becomes more difficult because the airways get smaller. When this happens there can be coughing, a whistling sound when breathing (wheezing), and difficulty breathing. Bronchospasm is often associated with asthma, but not all patients who experience a bronchospasm have asthma. CAUSES  A bronchospasm is caused by inflammation or irritation of the airways. The inflammation or irritation may be triggered by:   Allergies (such as to animals, pollen, food, or mold). Allergens that cause bronchospasm may cause wheezing immediately after exposure or many hours later.   Infection. Viral infections are believed to be the  most common cause of bronchospasm.   Exercise.   Irritants (such as pollution, cigarette smoke, strong odors, aerosol sprays, and paint fumes).   Weather changes. Winds increase molds and pollens in the air. Rain refreshes the air by washing irritants out. Cold air may cause inflammation.   Stress and emotional upset.  SIGNS AND SYMPTOMS   Wheezing.   Excessive nighttime coughing.   Frequent or severe coughing with a simple cold.   Chest tightness.   Shortness of breath.  DIAGNOSIS  Bronchospasm is usually diagnosed through a history and physical exam. Tests, such as chest X-rays, are sometimes done to look for other conditions. TREATMENT   Inhaled medicines can be given to open up your airways and help you breathe. The medicines can be given using either an inhaler or a nebulizer machine.  Corticosteroid medicines may be given for severe bronchospasm, usually when it is associated with asthma. HOME CARE INSTRUCTIONS   Always have a plan prepared for seeking medical care. Know when to call your health care provider and local emergency services (911 in the [place] Know where you can access local emergency care.  Only take  medicines as directed by your health care provider.  If you were prescribed an inhaler or nebulizer machine, ask your health care provider to explain how to use it correctly. Always use a spacer with your inhaler if you were given one.  It is necessary to remain calm during an attack. Try to relax and breathe more slowly.  Control your home environment in the following ways:   Change your heating and air conditioning filter at least once a month.   Limit your use of fireplaces and wood stoves.  Do not smoke and do not allow smoking in your home.   Avoid exposure to perfumes and fragrances.   Get rid of pests (such as roaches and mice) and their droppings.   Throw away plants if you see mold on them.   Keep your house clean and dust free.   Replace carpet with wood, tile, or vinyl flooring. Carpet can trap dander and dust.   Use allergy-proof pillows, mattress covers, and box spring covers.   Wash bed sheets and blankets every week in hot water and dry them in a dryer.   Use blankets that are made of polyester or cotton.   Wash hands frequently. SEEK MEDICAL CARE IF:   You have muscle aches.   You have chest pain.   The sputum changes from clear or white to yellow, green, gray, or bloody.   The sputum you cough up gets thicker.   There are problems that may be related to the medicine you are given, such as a rash, itching, swelling, or trouble breathing.  SEEK IMMEDIATE MEDICAL CARE IF:   You have worsening wheezing and coughing even after taking your prescribed medicines.   You have increased difficulty breathing.   You develop severe chest pain. MAKE SURE YOU:   Understand these instructions.  Will watch your condition.  Will get help right away if you are not doing well or get worse. Document Released: 11/04/2003 Document Revised: 11/06/2013 Document Reviewed: 04/23/2013 [facility] Patient Information 2015 [place], [place]. This information  is not intended to replace advice given to you by your health care provider. Make sure you discuss any questions you have with your health care provider.   Cough, Adult  A cough is a reflex. It helps you clear your throat and airways. A cough can help heal  your body. A cough can last 2 or 3 weeks (acute) or may last more than 8 weeks (chronic). Some common causes of a cough can include an infection, allergy, or a cold. HOME CARE  Only take medicine as told by your doctor.  If given, take your medicines (antibiotics) as told. Finish them even if you start to feel better.  Use a cold steam vaporizer or humidifier in your home. This can help loosen thick spit (secretions).  Sleep so you are almost sitting up (semi-upright). Use pillows to do this. This helps reduce coughing.  Rest as needed.  Stop smoking if you smoke. GET HELP RIGHT AWAY IF:  You have yellowish-white fluid (pus) in your thick spit.  Your cough gets worse.  Your medicine does not reduce coughing, and you are losing sleep.  You cough up blood.  You have trouble breathing.  Your pain gets worse and medicine does not help.  You have a fever. MAKE SURE YOU:   Understand these instructions.  Will watch your condition.  Will get help right away if you are not doing well or get worse. Document Released: 07/15/2011 Document Revised: 03/18/2014 Document Reviewed: 07/15/2011 [facility] Patient Information 2015 [place], [place]. This information is not intended to replace advice given to you by your health care provider. Make sure you discuss any questions you have with your health care provider.   Cellulitis Cellulitis is an infection of the skin and the tissue beneath it. The infected area is usually red and tender. Cellulitis occurs most often in the arms and lower legs.  CAUSES  Cellulitis is caused by bacteria that enter the skin through cracks or cuts in the skin. The most common types of bacteria that cause  cellulitis are staphylococci and streptococci. SIGNS AND SYMPTOMS   Redness and warmth.  Swelling.  Tenderness or pain.  Fever. DIAGNOSIS  Your health care provider can usually determine what is wrong based on a physical exam. Blood tests may also be done. TREATMENT  Treatment usually involves taking an antibiotic medicine. HOME CARE INSTRUCTIONS   Take your antibiotic medicine as directed by your health care provider. Finish the antibiotic even if you start to feel better.  Keep the infected arm or leg elevated to reduce swelling.  Apply a warm cloth to the affected area up to 4 times per day to relieve pain.  Take medicines only as directed by your health care provider.  Keep all follow-up visits as directed by your health care provider. SEEK MEDICAL CARE IF:   You notice red streaks coming from the infected area.  Your red area gets larger or turns dark in color.  Your bone or joint underneath the infected area becomes painful after the skin has healed.  Your infection returns in the same area or another area.  You notice a swollen bump in the infected area.  You develop new symptoms.  You have a fever. SEEK IMMEDIATE MEDICAL CARE IF:   You feel very sleepy.  You develop vomiting or diarrhea.  You have a general ill feeling (malaise) with muscle aches and pains. MAKE SURE YOU:   Understand these instructions.  Will watch your condition.  Will get help right away if you are not doing well or get worse. Document Released: 08/11/2005 Document Revised: 03/18/2014 Document Reviewed: 01/17/2012 [facility] Patient Information 2015 [place], [place]. This information is not intended to replace advice given to you by your health care provider. Make sure you discuss any questions you have with  your health care provider.     I personally performed the services described in this documentation, which was scribed in my presence. The recorded information has been reviewed  and considered, and addended by me as needed.

## 2014-12-22 NOTE — Patient Instructions (Signed)
Start doxycycline twice per day for leg infection and cough.  Albuterol if needed for wheezing.  Hot compresses to leg wound 4-5 times per day, and recheck in next 3 days if not improving. Return to the clinic or go to the nearest emergency room if any of your symptoms worsen or new symptoms occur. Bronchospasm A bronchospasm is a spasm or tightening of the airways going into the lungs. During a bronchospasm breathing becomes more difficult because the airways get smaller. When this happens there can be coughing, a whistling sound when breathing (wheezing), and difficulty breathing. Bronchospasm is often associated with asthma, but not all patients who experience a bronchospasm have asthma. CAUSES  A bronchospasm is caused by inflammation or irritation of the airways. The inflammation or irritation may be triggered by:   Allergies (such as to animals, pollen, food, or mold). Allergens that cause bronchospasm may cause wheezing immediately after exposure or many hours later.   Infection. Viral infections are believed to be the most common cause of bronchospasm.   Exercise.   Irritants (such as pollution, cigarette smoke, strong odors, aerosol sprays, and paint fumes).   Weather changes. Winds increase molds and pollens in the air. Rain refreshes the air by washing irritants out. Cold air may cause inflammation.   Stress and emotional upset.  SIGNS AND SYMPTOMS   Wheezing.   Excessive nighttime coughing.   Frequent or severe coughing with a simple cold.   Chest tightness.   Shortness of breath.  DIAGNOSIS  Bronchospasm is usually diagnosed through a history and physical exam. Tests, such as chest X-rays, are sometimes done to look for other conditions. TREATMENT   Inhaled medicines can be given to open up your airways and help you breathe. The medicines can be given using either an inhaler or a nebulizer machine.  Corticosteroid medicines may be given for severe  bronchospasm, usually when it is associated with asthma. HOME CARE INSTRUCTIONS   Always have a plan prepared for seeking medical care. Know when to call your health care provider and local emergency services (911 in the [place] Know where you can access local emergency care.  Only take medicines as directed by your health care provider.  If you were prescribed an inhaler or nebulizer machine, ask your health care provider to explain how to use it correctly. Always use a spacer with your inhaler if you were given one.  It is necessary to remain calm during an attack. Try to relax and breathe more slowly.  Control your home environment in the following ways:   Change your heating and air conditioning filter at least once a month.   Limit your use of fireplaces and wood stoves.  Do not smoke and do not allow smoking in your home.   Avoid exposure to perfumes and fragrances.   Get rid of pests (such as roaches and mice) and their droppings.   Throw away plants if you see mold on them.   Keep your house clean and dust free.   Replace carpet with wood, tile, or vinyl flooring. Carpet can trap dander and dust.   Use allergy-proof pillows, mattress covers, and box spring covers.   Wash bed sheets and blankets every week in hot water and dry them in a dryer.   Use blankets that are made of polyester or cotton.   Wash hands frequently. SEEK MEDICAL CARE IF:   You have muscle aches.   You have chest pain.   The sputum changes from clear  or white to yellow, green, gray, or bloody.   The sputum you cough up gets thicker.   There are problems that may be related to the medicine you are given, such as a rash, itching, swelling, or trouble breathing.  SEEK IMMEDIATE MEDICAL CARE IF:   You have worsening wheezing and coughing even after taking your prescribed medicines.   You have increased difficulty breathing.   You develop severe chest pain. MAKE SURE YOU:     Understand these instructions.  Will watch your condition.  Will get help right away if you are not doing well or get worse. Document Released: 11/04/2003 Document Revised: 11/06/2013 Document Reviewed: 04/23/2013 [facility] Patient Information 2015 [place], [place]. This information is not intended to replace advice given to you by your health care provider. Make sure you discuss any questions you have with your health care provider.   Cough, Adult  A cough is a reflex. It helps you clear your throat and airways. A cough can help heal your body. A cough can last 2 or 3 weeks (acute) or may last more than 8 weeks (chronic). Some common causes of a cough can include an infection, allergy, or a cold. HOME CARE  Only take medicine as told by your doctor.  If given, take your medicines (antibiotics) as told. Finish them even if you start to feel better.  Use a cold steam vaporizer or humidifier in your home. This can help loosen thick spit (secretions).  Sleep so you are almost sitting up (semi-upright). Use pillows to do this. This helps reduce coughing.  Rest as needed.  Stop smoking if you smoke. GET HELP RIGHT AWAY IF:  You have yellowish-white fluid (pus) in your thick spit.  Your cough gets worse.  Your medicine does not reduce coughing, and you are losing sleep.  You cough up blood.  You have trouble breathing.  Your pain gets worse and medicine does not help.  You have a fever. MAKE SURE YOU:   Understand these instructions.  Will watch your condition.  Will get help right away if you are not doing well or get worse. Document Released: 07/15/2011 Document Revised: 03/18/2014 Document Reviewed: 07/15/2011 [facility] Patient Information 2015 [place], [place]. This information is not intended to replace advice given to you by your health care provider. Make sure you discuss any questions you have with your health care provider.   Cellulitis Cellulitis is an infection  of the skin and the tissue beneath it. The infected area is usually red and tender. Cellulitis occurs most often in the arms and lower legs.  CAUSES  Cellulitis is caused by bacteria that enter the skin through cracks or cuts in the skin. The most common types of bacteria that cause cellulitis are staphylococci and streptococci. SIGNS AND SYMPTOMS   Redness and warmth.  Swelling.  Tenderness or pain.  Fever. DIAGNOSIS  Your health care provider can usually determine what is wrong based on a physical exam. Blood tests may also be done. TREATMENT  Treatment usually involves taking an antibiotic medicine. HOME CARE INSTRUCTIONS   Take your antibiotic medicine as directed by your health care provider. Finish the antibiotic even if you start to feel better.  Keep the infected arm or leg elevated to reduce swelling.  Apply a warm cloth to the affected area up to 4 times per day to relieve pain.  Take medicines only as directed by your health care provider.  Keep all follow-up visits as directed by your health care  provider. SEEK MEDICAL CARE IF:   You notice red streaks coming from the infected area.  Your red area gets larger or turns dark in color.  Your bone or joint underneath the infected area becomes painful after the skin has healed.  Your infection returns in the same area or another area.  You notice a swollen bump in the infected area.  You develop new symptoms.  You have a fever. SEEK IMMEDIATE MEDICAL CARE IF:   You feel very sleepy.  You develop vomiting or diarrhea.  You have a general ill feeling (malaise) with muscle aches and pains. MAKE SURE YOU:   Understand these instructions.  Will watch your condition.  Will get help right away if you are not doing well or get worse. Document Released: 08/11/2005 Document Revised: 03/18/2014 Document Reviewed: 01/17/2012 [facility] Patient Information 2015 [place], [place]. This information is not intended to  replace advice given to you by your health care provider. Make sure you discuss any questions you have with your health care provider.

## 2014-12-25 LAB — WOUND CULTURE: Gram Stain: NONE SEEN

## 2015-08-24 ENCOUNTER — Encounter (HOSPITAL_COMMUNITY): Payer: Self-pay | Admitting: Emergency Medicine

## 2015-08-24 ENCOUNTER — Emergency Department (HOSPITAL_COMMUNITY)
Admission: EM | Admit: 2015-08-24 | Discharge: 2015-08-25 | Disposition: A | Discharge status: Home / Self Care | Payer: Commercial Managed Care - HMO | Attending: Emergency Medicine | Admitting: Emergency Medicine

## 2015-08-24 DIAGNOSIS — Z720 Tobacco use: Secondary | ICD-10-CM | POA: Insufficient documentation

## 2015-08-24 DIAGNOSIS — K047 Periapical abscess without sinus: Secondary | ICD-10-CM | POA: Diagnosis not present

## 2015-08-24 DIAGNOSIS — K0889 Other specified disorders of teeth and supporting structures: Secondary | ICD-10-CM | POA: Diagnosis present

## 2015-08-24 DIAGNOSIS — G478 Other sleep disorders: Secondary | ICD-10-CM | POA: Diagnosis not present

## 2015-08-24 DIAGNOSIS — Z79899 Other long term (current) drug therapy: Secondary | ICD-10-CM | POA: Insufficient documentation

## 2015-08-24 NOTE — ED Notes (Signed)
Left facial cheek swelling, pt states dental/facial pain/swelling began yesterday and has increased into today. Pt has a broken tooth to left upper side of mouth, states he does not have a dentist.

## 2015-08-25 MED ORDER — PENICILLIN V POTASSIUM 500 MG PO TABS
250.0000 mg | ORAL_TABLET | Freq: Once | ORAL | Status: AC
  Administered 2015-08-25: 250 mg via ORAL
  Filled 2015-08-25: qty 1

## 2015-08-25 MED ORDER — PENICILLIN V POTASSIUM 500 MG PO TABS: 500.0000 mg | ORAL_TABLET | Freq: Four times a day (QID) | ORAL | Status: DC

## 2015-08-25 NOTE — ED Provider Notes (Signed)
CSN: [id number]     Arrival date & time 08/24/15  2248 History   First MD Initiated Contact with Patient 08/25/15 0008     Chief Complaint  Patient presents with  . Dental Pain     (Consider location/radiation/quality/duration/timing/severity/associated sxs/prior Treatment) HPI Comments: Patient presents to the emergency department with a dental complaint. Symptoms began Friday. The patient has tried to alleviate pain with nothing.  Pain is moderate, characterized as throbbing in nature and located left upper molar. Patient denies fever, night sweats, chills, difficulty swallowing or opening mouth, SOB, nuchal rigidity or decreased ROM of neck.  Patient does not have a dentist and requests a resource guide at discharge.   The history is provided by the patient. No language interpreter was used.    History reviewed. No pertinent past medical history. History reviewed. No pertinent past surgical history. Family History  Problem Relation Age of Onset  . Seizures Father   . Alcoholism Father    Social History  Substance Use Topics  . Smoking status: Current Every Day Smoker -- 1.00 packs/day  . Smokeless tobacco: Never Used  . Alcohol Use: 0.0 oz/week    0 Standard drinks or equivalent per week     Comment: occasionally    Review of Systems  Constitutional: Negative for fever and chills.  HENT: Positive for dental problem. Negative for drooling.   Neurological: Negative for speech difficulty.  Psychiatric/Behavioral: Positive for sleep disturbance.      Allergies  Review of patient's allergies indicates no known allergies.  Home Medications   Prior to Admission medications   Medication Sig Start Date End Date Taking? Authorizing Provider  naproxen sodium (ANAPROX) 220 MG tablet Take 220 mg by mouth 2 (two) times daily as needed. For pain   Yes Historical Provider, MD  albuterol (PROVENTIL HFA;VENTOLIN HFA) 108 (90 BASE) MCG/ACT inhaler Inhale 1-2 puffs into the lungs  every 6 (six) hours as needed for wheezing. Patient not taking: Reported on 08/25/2015 12/22/14   Shade Flood, MD  doxycycline (VIBRA-TABS) 100 MG tablet Take 1 tablet (100 mg total) by mouth 2 (two) times daily. Patient not taking: Reported on 08/25/2015 12/22/14   Shade Flood, MD  penicillin v potassium (VEETID) 500 MG tablet Take 1 tablet (500 mg total) by mouth 4 (four) times daily. 08/25/15   Roxy Horseman, PA-C   BP 121/65 mmHg  Pulse 64  Temp(Src) 97.7 F (36.5 C) (Oral)  Resp 18  SpO2 99% Physical Exam  Constitutional: He is oriented to person, place, and time. He appears well-developed and well-nourished.  HENT:  Head: Normocephalic and atraumatic.  Mouth/Throat:    Affected tooth as diagrammed.  No signs of peritonsillar or tonsillar abscess.  No signs of gingival abscess. Oropharynx is clear and without exudates.  Uvula is midline.  Airway is intact. No signs of Ludwig's angina with palpation of oral and sublingual mucosa.   Eyes: Conjunctivae and EOM are normal.  Neck: Normal range of motion.  Cardiovascular: Normal rate.   Pulmonary/Chest: Effort normal.  Abdominal: He exhibits no distension.  Musculoskeletal: Normal range of motion.  Neurological: He is alert and oriented to person, place, and time.  Skin: Skin is dry.  Psychiatric: He has a normal mood and affect. His behavior is normal. Judgment and thought content normal.  Nursing note and vitals reviewed.   ED Course  Procedures (including critical care time)   MDM   Final diagnoses:  Dental abscess    Patient with toothache.  No gross abscess.  Exam unconcerning for Ludwig's angina or spread of infection.  Will treat with penicillin and OTC pain medicine.  Urged patient to follow-up with dentist.      Roxy Horseman, PA-C 08/25/15 0024  Cy Blamer, MD 08/25/15 1610

## 2015-08-25 NOTE — Discharge Instructions (Signed)

## 2015-10-13 ENCOUNTER — Ambulatory Visit (INDEPENDENT_AMBULATORY_CARE_PROVIDER_SITE_OTHER): Payer: Self-pay | Admitting: Internal Medicine

## 2015-10-13 VITALS — BP 122/83 | HR 71 | Temp 99.2°F | Resp 16 | Ht 69.0 in | Wt 222.4 lb

## 2015-10-13 DIAGNOSIS — R059 Cough, unspecified: Secondary | ICD-10-CM

## 2015-10-13 DIAGNOSIS — R05 Cough: Secondary | ICD-10-CM

## 2015-10-13 DIAGNOSIS — J209 Acute bronchitis, unspecified: Secondary | ICD-10-CM

## 2015-10-13 DIAGNOSIS — F17200 Nicotine dependence, unspecified, uncomplicated: Secondary | ICD-10-CM

## 2015-10-13 DIAGNOSIS — Z720 Tobacco use: Secondary | ICD-10-CM

## 2015-10-13 MED ORDER — AZITHROMYCIN 500 MG PO TABS: 500.0000 mg | ORAL_TABLET | Freq: Every day | ORAL | Status: DC

## 2015-10-13 NOTE — Patient Instructions (Signed)
Nicotine chewing gum What is this medicine? NICOTINE (NIK [place] teen) helps people stop smoking. This medicine replaces the nicotine found in cigarettes and helps to decrease withdrawal effects. It is most effective when used in combination with a stop-smoking program. This medicine may be used for other purposes; ask your health care provider or pharmacist if you have questions. What should I tell my health care provider before I take this medicine? They need to know if you have any of these conditions: -diabetes -heart disease, angina, irregular heartbeat or previous heart attack -high blood pressure -lung disease, including asthma -overactive thyroid -pheochromocytoma -seizures or history of seizures -stomach problems or ulcers -an unusual or allergic reaction to nicotine, other medicines, foods, dyes, or preservatives -pregnant or trying to get pregnant -breast-feeding How should I use this medicine? Chew but do not swallow the gum. Follow the directions that come with the chewing gum. Use exactly as directed. When you feel an urgent desire for a cigarette, chew one piece of gum slowly. Continue chewing until you taste the gum or feel a slight tingling in your mouth. Then, stop chewing and place the gum between your cheek and gum. Wait until the taste or tingling is almost gone then start chewing again. Continue chewing in this manner for about 30 minutes. Slow chewing helps reduce cravings and also helps reduce the chance for heartburn or other gastrointestinal side effects. Talk to your pediatrician regarding the use of this medicine in children. Special care may be needed. Overdosage: If you think you have taken too much of this medicine contact a poison control center or emergency room at once. NOTE: This medicine is only for you. Do not share this medicine with others. What if I miss a dose? This does not apply. Only use the chewing gum when you have a strong desire to smoke. Do not use  more than one piece of gum at a time. What may interact with this medicine? -medicines for asthma -medicines for blood pressure -medicines for mental depression This list may not describe all possible interactions. Give your health care provider a list of all the medicines, herbs, non-prescription drugs, or dietary supplements you use. Also tell them if you smoke, drink alcohol, or use illegal drugs. Some items may interact with your medicine. What should I watch for while using this medicine? Always carry the nicotine gum with you. Do not use more than 30 pieces of gum a day. Too much gum can increase the risk of an overdose. As the urge to smoke gets less, gradually reduce the number of pieces each day over a period of 2 to 3 months. When you are only using 1 or 2 pieces a day, stop using the nicotine gum. You should begin using the nicotine gum the day you stop smoking. It is okay if you do not succeed with the attempt to quit and have a cigarette. You can still continue your quit attempt and keep using the product as directed. Just throw away your cigarettes and get back to your quit plan. If your mouth gets sore from chewing the gum, suck hard sugarless candy between pieces of gum to help relieve the soreness. Brush your teeth regularly to reduce mouth irritation. If you wear dentures, contact your doctor or health care professional if the gum sticks to your dental work. If you are a diabetic and you quit smoking, the effects of insulin may be increased and you may need to reduce your insulin dose. Check with  your doctor or health care professional about how you should adjust your insulin dose. What side effects may I notice from receiving this medicine? Side effects that you should report to your doctor or health care professional as soon as possible: -allergic reactions like skin rash, itching or hives, swelling of the face, lips, or tongue -blisters in mouth -breathing problems -changes in  hearing -changes in vision -chest pain -cold sweats -confusion -fast, irregular heartbeat -feeling faint or lightheaded, falls -headache -increased saliva -nausea, vomiting -stomach pain -weakness Side effects that usually do not require medical attention (report to your doctor or health care professional if they continue or are bothersome): -diarrhea -dry mouth -hiccups -irritability -nervousness or restlessness -trouble sleeping or vivid dreams This list may not describe all possible side effects. Call your doctor for medical advice about side effects. You may report side effects to FDA at 1-800-FDA-1088. Where should I keep my medicine? Keep out of the reach of children. Store at room temperature between 15 and 30 degrees C (59 and 86 degrees F). Protect from heat and light. Throw away unused medicine after the expiration date. NOTE: This sheet is a summary. It may not cover all possible information. If you have questions about this medicine, talk to your doctor, pharmacist, or health care provider.    2016, Elsevier/Gold Standard. (2015-04-28 19:37:14) Smoking Cessation, Tips for Success If you are ready to quit smoking, congratulations! You have chosen to help yourself be healthier. Cigarettes bring nicotine, tar, carbon monoxide, and other irritants into your body. Your lungs, heart, and blood vessels will be able to work better without these poisons. There are many different ways to quit smoking. Nicotine gum, nicotine patches, a nicotine inhaler, or nicotine nasal spray can help with physical craving. Hypnosis, support groups, and medicines help break the habit of smoking. WHAT THINGS CAN I DO TO MAKE QUITTING EASIER?  Here are some tips to help you quit for good:  Pick a date when you will quit smoking completely. Tell all of your friends and family about your plan to quit on that date.  Do not try to slowly cut down on the number of cigarettes you are smoking. Pick a quit  date and quit smoking completely starting on that day.  Throw away all cigarettes.   Clean and remove all ashtrays from your home, work, and car.  On a card, write down your reasons for quitting. Carry the card with you and read it when you get the urge to smoke.  Cleanse your body of nicotine. Drink enough water and fluids to keep your urine clear or pale yellow. Do this after quitting to flush the nicotine from your body.  Learn to predict your moods. Do not let a bad situation be your excuse to have a cigarette. Some situations in your life might tempt you into wanting a cigarette.  Never have "just one" cigarette. It leads to wanting another and another. Remind yourself of your decision to quit.  Change habits associated with smoking. If you smoked while driving or when feeling stressed, try other activities to replace smoking. Stand up when drinking your coffee. Brush your teeth after eating. Sit in a different chair when you read the paper. Avoid alcohol while trying to quit, and try to drink fewer caffeinated beverages. Alcohol and caffeine may urge you to smoke.  Avoid foods and drinks that can trigger a desire to smoke, such as sugary or spicy foods and alcohol.  Ask people who smoke not  to smoke around you.  Have something planned to do right after eating or having a cup of coffee. For example, plan to take a walk or exercise.  Try a relaxation exercise to calm you down and decrease your stress. Remember, you may be tense and nervous for the first 2 weeks after you quit, but this will pass.  Find new activities to keep your hands busy. Play with a pen, coin, or rubber band. Doodle or draw things on paper.  Brush your teeth right after eating. This will help cut down on the craving for the taste of tobacco after meals. You can also try mouthwash.   Use oral substitutes in place of cigarettes. Try using lemon drops, carrots, cinnamon sticks, or chewing gum. Keep them handy so  they are available when you have the urge to smoke.  When you have the urge to smoke, try deep breathing.  Designate your home as a nonsmoking area.  If you are a heavy smoker, ask your health care provider about a prescription for nicotine chewing gum. It can ease your withdrawal from nicotine.  Reward yourself. Set aside the cigarette money you save and buy yourself something nice.  Look for support from others. Join a support group or smoking cessation program. Ask someone at home or at work to help you with your plan to quit smoking.  Always ask yourself, "Do I need this cigarette or is this just a reflex?" Tell yourself, "Today, I choose not to smoke," or "I do not want to smoke." You are reminding yourself of your decision to quit.  Do not replace cigarette smoking with electronic cigarettes (commonly called e-cigarettes). The safety of e-cigarettes is unknown, and some may contain harmful chemicals.  If you relapse, do not give up! Plan ahead and think about what you will do the next time you get the urge to smoke. HOW WILL I FEEL WHEN I QUIT SMOKING? You may have symptoms of withdrawal because your body is used to nicotine (the addictive substance in cigarettes). You may crave cigarettes, be irritable, feel very hungry, cough often, get headaches, or have difficulty concentrating. The withdrawal symptoms are only temporary. They are strongest when you first quit but will go away within 10-14 days. When withdrawal symptoms occur, stay in control. Think about your reasons for quitting. Remind yourself that these are signs that your body is healing and getting used to being without cigarettes. Remember that withdrawal symptoms are easier to treat than the major diseases that smoking can cause.  Even after the withdrawal is over, expect periodic urges to smoke. However, these cravings are generally short lived and will go away whether you smoke or not. Do not smoke! WHAT RESOURCES ARE  AVAILABLE TO HELP ME QUIT SMOKING? Your health care provider can direct you to community resources or hospitals for support, which may include:  Group support.  Education.  Hypnosis.  Therapy.   This information is not intended to replace advice given to you by your health care provider. Make sure you discuss any questions you have with your health care provider.   Document Released: 07/30/2004 Document Revised: 11/22/2014 Document Reviewed: 04/19/2013 Elsevier Interactive Patient Education [employer].

## 2015-10-13 NOTE — Progress Notes (Signed)
Patient ID: Tyler Hines, male   DOB: 1976-07-15, 39 y.o.   MRN: [id number]   10/13/2015 at 8:44 AM  Tyler Hines / DOB: 1976-07-15 / MRN: [id number]  Problem list reviewed and updated by me where necessary.   SUBJECTIVE  Tyler Hines is a 39 y.o. well appearing male presenting for the chief complaint of cough and chest congestion..     He  has no past medical history on file.    Medications reviewed and updated by myself where necessary, and exist elsewhere in the encounter.   Tyler Hines has no allergies on file. He  reports that he has been smoking.  He has never used smokeless tobacco. He reports that he drinks alcohol. He reports that he does not use illicit drugs. He  reports that he currently engages in sexual activity. The patient  has no past surgical history on file.  His family history includes Alcoholism in his father; Seizures in his father.  Review of Systems  Constitutional: Positive for fever and malaise/fatigue. Negative for chills and diaphoresis.  HENT: Positive for congestion.   Respiratory: Positive for cough and wheezing. Negative for shortness of breath.   Cardiovascular: Negative for chest pain.  Neurological: Negative.   Psychiatric/Behavioral: Negative.     OBJECTIVE  His  height is 5\' 9"  (1.753 m) and weight is 222 lb 6.4 oz (100.88 kg). His oral temperature is 99.2 F (37.3 C). His blood pressure is 122/83 and his pulse is 71. His respiration is 16.  The patient's body mass index is 32.83 kg/(m^2).  Physical Exam  Constitutional: He is oriented to person, place, and time. He appears well-developed and well-nourished. No distress.  HENT:  Head: Normocephalic.  Nose: Nose normal.  Eyes: Conjunctivae and EOM are normal. Pupils are equal, round, and reactive to light.  Neck: Normal range of motion.  Cardiovascular: Normal rate.   Respiratory: Effort normal. No respiratory distress. He has wheezes. He has no rales. He exhibits no tenderness.    Musculoskeletal: Normal range of motion.  Neurological: He is alert and oriented to person, place, and time. He exhibits normal muscle tone. Coordination normal.  Psychiatric: He has a normal mood and affect.    No results found for this or any previous visit (from the past 24 hour(s)).  ASSESSMENT & PLAN  Tyler Hines was seen today for cough and chest congestion.  Diagnoses and all orders for this visit:  Cough -     azithromycin (ZITHROMAX) 500 MG tablet; Take 1 tablet (500 mg total) by mouth daily.  Bronchospasm with bronchitis, acute -     azithromycin (ZITHROMAX) 500 MG tablet; Take 1 tablet (500 mg total) by mouth daily.  Smoker Quit plan

## 2015-10-18 ENCOUNTER — Telehealth: Payer: Self-pay

## 2015-10-18 NOTE — Telephone Encounter (Signed)
Patient called in stating that he took all of his Z-pac and that he is still sick, he would like someone to give him a call at [phone] to see about getting a different kind of med or something to help with that.

## 2015-10-20 NOTE — Telephone Encounter (Signed)
Spoke with pt, advised to RTC. Pt understood. 

## 2015-10-20 NOTE — Telephone Encounter (Signed)
Patient should rtc for re-evaluation if he finished his Z-pack and has no improvement.

## 2015-10-20 NOTE — Telephone Encounter (Signed)
ASSESSMENT & PLAN  Tyler Hines was seen today for cough and chest congestion.  Diagnoses and all orders for this visit:  Cough - azithromycin (ZITHROMAX) 500 MG tablet; Take 1 tablet (500 mg total) by mouth daily.  Bronchospasm with bronchitis, acute - azithromycin (ZITHROMAX) 500 MG tablet; Take 1 tablet (500 mg total) by mouth daily.  Smoker Quit plan   Spoke with pt, but he still has the chest congestion. He is coughing up yellow brownish mucus. He states that when he was given PCN last time for the same thing and it knocked the infection out. Dr. Perrin MalteseGuest not here, can someone help with this?

## 2018-03-20 ENCOUNTER — Encounter (HOSPITAL_COMMUNITY): Payer: Self-pay | Admitting: Family Medicine

## 2018-03-20 ENCOUNTER — Ambulatory Visit (HOSPITAL_COMMUNITY)
Admission: EM | Admit: 2018-03-20 | Discharge: 2018-03-20 | Disposition: A | Discharge status: Home / Self Care | Payer: 59 | Attending: Family Medicine | Admitting: Family Medicine

## 2018-03-20 DIAGNOSIS — M5442 Lumbago with sciatica, left side: Secondary | ICD-10-CM

## 2018-03-20 MED ORDER — KETOROLAC TROMETHAMINE 60 MG/2ML IM SOLN
INTRAMUSCULAR | Status: AC
  Filled 2018-03-20: qty 2

## 2018-03-20 MED ORDER — PREDNISONE 20 MG PO TABS: 40.0000 mg | ORAL_TABLET | Freq: Every day | ORAL | 0 refills | Status: AC

## 2018-03-20 MED ORDER — NAPROXEN 500 MG PO TABS: 500.0000 mg | ORAL_TABLET | Freq: Two times a day (BID) | ORAL | 0 refills | Status: DC

## 2018-03-20 MED ORDER — KETOROLAC TROMETHAMINE 60 MG/2ML IM SOLN
60.0000 mg | Freq: Once | INTRAMUSCULAR | Status: AC
  Administered 2018-03-20: 60 mg via INTRAMUSCULAR

## 2018-03-20 NOTE — Discharge Instructions (Signed)
May start with naproxen  twice a day, first dose tomorrow. If in the next 2 days no improvement may take course of prednisone as well. Light and regular activity, limit lifting to <25lbs for the next three days. Use of core muscles with lifting.  If symptoms worsen or do not improve in the next 2-3 weeks to return to be seen or to follow up with your PCP.

## 2018-03-20 NOTE — ED Provider Notes (Signed)
[facility]    CSN: [id number] Arrival date & time: 03/20/18  1900     History   Chief Complaint Chief Complaint  Patient presents with  . Back Pain    HPI Tyler Hines is a 41 y.o. male.   Tyler Hines presents with complaints of midline low back pain which radiates down left posterior thigh with certain movements. Started last night after lifting an approximately 40lb bag from the ground. He has had sciatica in the past and states this feels similar. 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 Pain is 5/10, worse with lifting of left left and with bending forward. Has not taken any medications for symptoms. Feels better today than last night. Denies weakness, numbness or tingling to legs. Without urinary or stool incontinence. Without contributing medical history.      ROS per HPI.       History reviewed. No pertinent past medical history.  There are no active problems to display for this patient.   History reviewed. No pertinent surgical history.     Home Medications    Prior to Admission medications   Medication Sig Start Date End Date Taking? Authorizing Provider  naproxen (NAPROSYN) 500 MG tablet Take 1 tablet (500 mg total) by mouth 2 (two) times daily. 03/20/18   Georgetta Haber, NP  predniSONE (DELTASONE) 20 MG tablet Take 2 tablets (40 mg total) by mouth daily with breakfast for 5 days. 03/20/18 03/25/18  Georgetta Haber, NP    Family History Family History  Problem Relation Age of Onset  . Seizures Father   . Alcoholism Father     Social History Social History   Tobacco Use  . Smoking status: Current Every Day Smoker    Packs/day: 1.00  . Smokeless tobacco: Never Used  Substance Use Topics  . Alcohol use: Yes    Alcohol/week: 0.0 oz    Comment: occasionally  . Drug use: No     Allergies   Patient has no known allergies.   Review of Systems Review of Systems   Physical Exam Triage Vital Signs ED Triage  Vitals [03/20/18 1956]  Enc Vitals Group     BP 140/84     Pulse Rate 86     Resp 18     Temp 98 F (36.7 C)     Temp src      SpO2 98 %     Weight      Height      Head Circumference      Peak Flow      Pain Score 5     Pain Loc      Pain Edu?      Excl. in [place]?    No data found.  Updated Vital Signs BP 140/84   Pulse 86   Temp 98 F (36.7 C)   Resp 18   SpO2 98%   Visual Acuity Right Eye Distance:   Left Eye Distance:   Bilateral Distance:    Right Eye Near:   Left Eye Near:    Bilateral Near:     Physical Exam  Constitutional: He is oriented to person, place, and time. He appears well-developed and well-nourished.  Cardiovascular: Normal rate and regular rhythm.  Pulmonary/Chest: Effort normal and breath sounds normal.  Musculoskeletal:       Lumbar back: He exhibits decreased range of motion, tenderness and pain. He exhibits no bony tenderness, no swelling, no edema, no deformity,  no laceration, no spasm and normal pulse.       Back:  Pain with left leg straight leg raise; no pain with bilateral hip flexion; strength equal bilaterally and sensation intact. Ambulatory without difficulty. Pain with spinal flexion from standing; pain with transitioning from sit to lay and lay to sit   Neurological: He is alert and oriented to person, place, and time.  Skin: Skin is warm and dry.     UC Treatments / Results  Labs (all labs ordered are listed, but only abnormal results are displayed) Labs Reviewed - No data to display  EKG None  Radiology No results found.  Procedures Procedures (including critical care time)  Medications Ordered in UC Medications  ketorolac (TORADOL) injection 60 mg (has no administration in time range)    Initial Impression / Assessment and Plan / UC Course  I have reviewed the triage vital signs and the nursing notes.  Pertinent labs & imaging results that were available during my care of the patient were reviewed by me and  considered in my medical decision making (see chart for details).     Consistent with sciatica. toradol provided in clinic today. Naproxen bid. Prednisone if needed. Discussed lifting precautions and correct form, increasing core strength. Return precautions provided. Patient verbalized understanding and agreeable to plan.  Ambulatory out of clinic without difficulty.   Final Clinical Impressions(s) / UC Diagnoses   Final diagnoses:  Acute midline low back pain with left-sided sciatica     Discharge Instructions     May start with naproxen  twice a day, first dose tomorrow. If in the next 2 days no improvement may take course of prednisone as well. Light and regular activity, limit lifting to <25lbs for the next three days. Use of core muscles with lifting.  If symptoms worsen or do not improve in the next 2-3 weeks to return to be seen or to follow up with your PCP.     ED Prescriptions    Medication Sig Dispense Auth. Provider   naproxen (NAPROSYN) 500 MG tablet Take 1 tablet (500 mg total) by mouth 2 (two) times daily. 30 tablet Linus Mako B, NP   predniSONE (DELTASONE) 20 MG tablet Take 2 tablets (40 mg total) by mouth daily with breakfast for 5 days. 10 tablet Georgetta Haber, NP     Controlled Substance Prescriptions [place] Controlled Substance Registry consulted? Not Applicable   Georgetta Haber, NP 03/20/18 2017

## 2018-03-20 NOTE — ED Triage Notes (Signed)
Pt here for lower back pain, hx of sciatic nerve. This started after working at the [employer] last night. He also works at a [employer].

## 2021-12-06 ENCOUNTER — Encounter (HOSPITAL_COMMUNITY): Payer: Self-pay | Admitting: *Deleted

## 2021-12-06 ENCOUNTER — Ambulatory Visit (HOSPITAL_COMMUNITY)
Admission: EM | Admit: 2021-12-06 | Discharge: 2021-12-06 | Disposition: A | Discharge status: Home / Self Care | Payer: Federal, State, Local not specified - PPO

## 2021-12-06 DIAGNOSIS — E1140 Type 2 diabetes mellitus with diabetic neuropathy, unspecified: Secondary | ICD-10-CM

## 2021-12-06 DIAGNOSIS — J343 Hypertrophy of nasal turbinates: Secondary | ICD-10-CM | POA: Diagnosis not present

## 2021-12-06 DIAGNOSIS — J0100 Acute maxillary sinusitis, unspecified: Secondary | ICD-10-CM

## 2021-12-06 MED ORDER — FLUTICASONE PROPIONATE 50 MCG/ACT NA SUSP: NASAL | 0 refills | Status: DC

## 2021-12-06 MED ORDER — AMOXICILLIN-POT CLAVULANATE 875-125 MG PO TABS: 1.0000 | ORAL_TABLET | Freq: Two times a day (BID) | ORAL | 0 refills | Status: AC

## 2021-12-06 NOTE — ED Triage Notes (Signed)
C/O facial pressure and nasal congestion over past 2 days without any known fevers.  Has taken Benadryl and IBU for sxs. Declines Covid test at this time.

## 2021-12-06 NOTE — Discharge Instructions (Addendum)
Start taking the antibiotic twice daily with food.  Take a probiotic or yogurt to prevent diarrhea. Continue using the ibuprofen to help with inflammation, start using Flonase primarily to the right nostril. If the left nostril appears dry, you may purchase over-the-counter nasal gel or Rhinaris to aid in humidification. Warm mist vaporizer at night, steam from a shower, eucalyptus may also help with opening up the sinus passages. Sterile saline spray may also be beneficial to cleanse the sinus passages. Mucinex OTC may be used if you start developing a cough, or feel you cannot clear the mucous. Follow up with your PCP should symptoms persist.

## 2021-12-06 NOTE — ED Provider Notes (Signed)
[facility]    CSN: [id number] Arrival date & time: 12/06/21  1039      History   Chief Complaint Chief Complaint  Patient presents with   Nasal Congestion   Facial Pain    HPI DEARION YOHMAN is a 46 y.o. male.   Pleasant 46 year old male presents today with concerns of a right sided maxillary sinus pain.  He states he has been having some nasal congestion over the past week, but over the past several days he has been having severe pain primarily to the medial aspect of his right sinus.  He states he has been having significant congestion, discharge, and most recently blood in his sputum.  He states he has been coughing up thick chunks of colored sputum streaked with blood.  He denies any shortness of breath or chest pain.  He does have a history of diabetes which he states is "under control", with his sugars ranging between 120-150. He has tried over-the-counter ibuprofen which is helped somewhat with the discomfort, but the discharge and drainage continues.  He states the left side is unaffected, apart from a slightly dry nare.  He denies a known fever, but does admit to some tooth pain on the right.    History reviewed. No pertinent past medical history.  There are no problems to display for this patient.   History reviewed. No pertinent surgical history.     Home Medications    Prior to Admission medications   Medication Sig Start Date End Date Taking? Authorizing Provider  amoxicillin-clavulanate (AUGMENTIN) 875-125 MG tablet Take 1 tablet by mouth every 12 (twelve) hours for 10 days. 12/06/21 12/16/21 Yes Laurence Crofford L, PA  diphenhydrAMINE HCl (BENADRYL PO) Take by mouth.   Yes [provider]  fluticasone (FLONASE) 50 MCG/ACT nasal spray One spray up to twice daily in R nostil until symptoms resolve 12/06/21  Yes Paticia Moster L, PA  IBUPROFEN PO Take by mouth.   Yes [provider]  naproxen (NAPROSYN) 500 MG tablet Take 1 tablet (500 mg  total) by mouth 2 (two) times daily. 03/20/18   Zigmund Gottron, NP    Family History Family History  Problem Relation Age of Onset   Seizures Father    Alcoholism Father     Social History Social History   Tobacco Use   Smoking status: Former    Types: Cigars   Smokeless tobacco: Never  Vaping Use   Vaping Use: Never used  Substance Use Topics   Alcohol use: Yes    Alcohol/week: 10.0 standard drinks    Types: 10 Shots of liquor per week   Drug use: Yes    Types: Marijuana     Allergies   Patient has no known allergies.   Review of Systems Review of Systems  HENT:  Positive for facial swelling (cheek pain), postnasal drip, sinus pressure and sinus pain.   Neurological:  Positive for numbness (DM neuropathy).    Physical Exam Triage Vital Signs ED Triage Vitals  Enc Vitals Group     BP 12/06/21 1202 (!) 136/93     Pulse Rate 12/06/21 1202 89     Resp 12/06/21 1202 18     Temp 12/06/21 1202 99.2 F (37.3 C)     Temp Source 12/06/21 1202 Oral     SpO2 12/06/21 1202 96 %     Weight --      Height --      Head Circumference --  Peak Flow --      Pain Score 12/06/21 1205 6     Pain Loc --      Pain Edu? --      Excl. in [place]? --    No data found.  Updated Vital Signs BP 135/89    Pulse 89    Temp 99.2 F (37.3 C) (Oral)    Resp 18    SpO2 96%   Visual Acuity Right Eye Distance:   Left Eye Distance:   Bilateral Distance:    Right Eye Near:   Left Eye Near:    Bilateral Near:     Physical Exam HENT:     Head: Normocephalic and atraumatic. No raccoon eyes, contusion, right periorbital erythema or left periorbital erythema.     Jaw: There is normal jaw occlusion.     Salivary Glands: Right salivary gland is not diffusely enlarged or tender. Left salivary gland is not diffusely enlarged or tender.     Right Ear: Hearing, tympanic membrane, ear canal and external ear normal.     Left Ear: Hearing, tympanic membrane, ear canal and external ear  normal.     Nose: Congestion and rhinorrhea present.     Right Turbinates: Enlarged and swollen.     Left Turbinates: Not enlarged or swollen.     Right Sinus: Maxillary sinus tenderness present. No frontal sinus tenderness.     Left Sinus: No maxillary sinus tenderness or frontal sinus tenderness.     Mouth/Throat:     Lips: Pink.     Mouth: Mucous membranes are moist. No injury, oral lesions or angioedema.     Dentition: Normal dentition.     Tongue: No lesions.     Palate: No mass.     Pharynx: Oropharynx is clear. No pharyngeal swelling or oropharyngeal exudate.     Tonsils: No tonsillar exudate or tonsillar abscesses.     UC Treatments / Results  Labs (all labs ordered are listed, but only abnormal results are displayed) Labs Reviewed - No data to display  EKG   Radiology No results found.  Procedures Procedures (including critical care time)  Medications Ordered in UC Medications - No data to display  Initial Impression / Assessment and Plan / UC Course  I have reviewed the triage vital signs and the nursing notes.  Pertinent labs & imaging results that were available during my care of the patient were reviewed by me and considered in my medical decision making (see chart for details).     Sinusitis - start Augmentin twice daily x10 days.  Add sinus rinses, Flonase.  Humidification in warm mist vaporizer's may help Diabetes -follow-up with PCP for chronic maintenance.  Prednisone contraindicated due to this Nasal turbinate hypertrophy -Flonase, likely the cause of #1  Final Clinical Impressions(s) / UC Diagnoses   Final diagnoses:  Acute non-recurrent maxillary sinusitis  Type 2 diabetes mellitus with diabetic neuropathy, without long-term current use of insulin ([place])  Nasal turbinate hypertrophy     Discharge Instructions      Start taking the antibiotic twice daily with food.  Take a probiotic or yogurt to prevent diarrhea. Continue using the  ibuprofen to help with inflammation, start using Flonase primarily to the right nostril. If the left nostril appears dry, you may purchase over-the-counter nasal gel or Rhinaris to aid in humidification. Warm mist vaporizer at night, steam from a shower, eucalyptus may also help with opening up the sinus passages. Sterile saline spray may also be  beneficial to cleanse the sinus passages. Mucinex OTC may be used if you start developing a cough, or feel you cannot clear the mucous. Follow up with your PCP should symptoms persist.   ED Prescriptions     Medication Sig Dispense Auth. Provider   amoxicillin-clavulanate (AUGMENTIN) 875-125 MG tablet Take 1 tablet by mouth every 12 (twelve) hours for 10 days. 20 tablet Mahonri Seiden L, PA   fluticasone (FLONASE) 50 MCG/ACT nasal spray One spray up to twice daily in R nostil until symptoms resolve 15.8 mL Kaamil Morefield L, PA      PDMP not reviewed this encounter.   Chaney Malling, [place] 12/06/21 1259

## 2021-12-11 ENCOUNTER — Ambulatory Visit (HOSPITAL_COMMUNITY)
Admission: EM | Admit: 2021-12-11 | Discharge: 2021-12-11 | Disposition: A | Discharge status: Home / Self Care | Payer: Federal, State, Local not specified - PPO | Attending: Physician Assistant | Admitting: Physician Assistant

## 2021-12-11 ENCOUNTER — Encounter (HOSPITAL_COMMUNITY): Payer: Self-pay

## 2021-12-11 ENCOUNTER — Other Ambulatory Visit: Payer: Self-pay

## 2021-12-11 DIAGNOSIS — J0100 Acute maxillary sinusitis, unspecified: Secondary | ICD-10-CM | POA: Diagnosis not present

## 2021-12-11 DIAGNOSIS — R0981 Nasal congestion: Secondary | ICD-10-CM

## 2021-12-11 MED ORDER — PREDNISONE 10 MG PO TABS: 20.0000 mg | ORAL_TABLET | Freq: Every day | ORAL | 0 refills | Status: AC

## 2021-12-11 NOTE — Discharge Instructions (Signed)
Take prednisone 20 mg in the morning for 3 days.  Do not take NSAIDs including aspirin, ibuprofen/Advil, naproxen/Aleve with this medication as it can cause stomach bleeding.  This will raise your blood sugar so please avoid carbohydrates and drink lots of water.  If your blood sugars persistently above 200 please contact us or PCP.  Continue antibiotics as previously prescribed.  I recommend using Mucinex and sinus rinses over-the-counter.  If your symptoms or not improving please contact ENT to schedule an appointment for further evaluation and management.  If you have any severe symptoms including high fever not responding to medication, chest pain, shortness of breath, severe cough, nausea/vomiting interfering with oral intake you need to go to the emergency room.

## 2021-12-11 NOTE — ED Triage Notes (Signed)
Pt presents with c/o an on going fever x 1 week.   Pt states this morning he woke up with no fever.   States he has taken medicine for 5 days and states he still has bloody, pink mucus coming out of his nose.   States the R side of his face hurts. States when he tries to blow his nose he feels something is not coming out of it.

## 2021-12-11 NOTE — ED Provider Notes (Signed)
[place]    CSN: [id number] Arrival date & time: 12/11/21  0802      History   Chief Complaint Chief Complaint  Patient presents with   Nasal Congestion    HPI Tyler Hines is a 46 y.o. male.   Patient presents today with a 1 week history of URI symptoms.  He was seen by clinic 5 days ago (12/06/2021) at which point he was started on Augmentin and Flonase which she has been taking without significant improvement of symptoms.  He does have a history of diabetes and so prednisone was deferred due to concern for hyperglycemia.  Since last visit, patient reports continued symptoms including persistent nasal congestion, sinus pressure, headache, occasional cough.  Denies any nausea/vomiting/diarrhea, chest pain, shortness of breath.  Did have some GI upset after starting antibiotics but this has resolved with increasing food prior to antibiotic dose.  He denies any history of sinus infections and has not seen an ENT in the past.  Denies previous sinus surgery.  He has missed work as a result of symptoms and is requesting a work excuse note today.  He has not been using any over-the-counter medications for symptom management.  Denies history of allergies, asthma, smoking, COPD.   History reviewed. No pertinent past medical history.  There are no problems to display for this patient.   History reviewed. No pertinent surgical history.     Home Medications    Prior to Admission medications   Medication Sig Start Date End Date Taking? Authorizing Provider  amoxicillin-clavulanate (AUGMENTIN) 875-125 MG tablet Take 1 tablet by mouth every 12 (twelve) hours for 10 days. 12/06/21 12/16/21 Yes Crain, Whitney L, PA  fluticasone (FLONASE) 50 MCG/ACT nasal spray One spray up to twice daily in R nostil until symptoms resolve 12/06/21  Yes Crain, Whitney L, PA  predniSONE (DELTASONE) 10 MG tablet Take 2 tablets (20 mg total) by mouth daily for 3 days. 12/11/21 12/14/21 Yes [place]undra Hair  K, PA-C  diphenhydrAMINE HCl (BENADRYL PO) Take by mouth.    [provider]  IBUPROFEN PO Take by mouth.    [provider]    Family History Family History  Problem Relation Age of Onset   Seizures Father    Alcoholism Father     Social History Social History   Tobacco Use   Smoking status: Former    Types: Cigars   Smokeless tobacco: Never  Vaping Use   Vaping Use: Never used  Substance Use Topics   Alcohol use: Yes    Alcohol/week: 10.0 standard drinks    Types: 10 Shots of liquor per week   Drug use: Yes    Types: Marijuana     Allergies   Patient has no known allergies.   Review of Systems Review of Systems  Constitutional:  Positive for activity change and fever (Intermittent). Negative for appetite change and fatigue.  HENT:  Positive for congestion, postnasal drip, sinus pressure and sinus pain. Negative for sneezing and sore throat.   Respiratory:  Positive for cough. Negative for shortness of breath.   Cardiovascular:  Negative for chest pain.  Gastrointestinal:  Negative for abdominal pain, diarrhea, nausea and vomiting.  Musculoskeletal:  Negative for arthralgias and myalgias.  Neurological:  Positive for headaches. Negative for dizziness and light-headedness.    Physical Exam Triage Vital Signs ED Triage Vitals  Enc Vitals Group     BP 12/11/21 0820 (!) 152/89     Pulse Rate 12/11/21 0820 87  Resp 12/11/21 0820 17     Temp 12/11/21 0820 99.8 F (37.7 C)     Temp Source 12/11/21 0820 Oral     SpO2 12/11/21 0820 98 %     Weight --      Height --      Head Circumference --      Peak Flow --      Pain Score 12/11/21 0819 0     Pain Loc --      Pain Edu? --      Excl. in [place]? --    No data found.  Updated Vital Signs BP (!) 152/89 (BP Location: Right Arm)    Pulse 87    Temp 99.8 F (37.7 C) (Oral)    Resp 17    SpO2 98%   Visual Acuity Right Eye Distance:   Left Eye Distance:   Bilateral Distance:    Right Eye  Near:   Left Eye Near:    Bilateral Near:     Physical Exam Vitals reviewed.  Constitutional:      General: He is awake.     Appearance: Normal appearance. He is well-developed. He is not ill-appearing.     Comments: Very pleasant male appears stated age in no acute distress sitting comfortably in exam room  HENT:     Head: Normocephalic and atraumatic.     Right Ear: Tympanic membrane, ear canal and external ear normal. Tympanic membrane is not erythematous or bulging.     Left Ear: Tympanic membrane, ear canal and external ear normal. Tympanic membrane is not erythematous or bulging.     Nose: Congestion present.     Right Sinus: Maxillary sinus tenderness present. No frontal sinus tenderness.     Left Sinus: No maxillary sinus tenderness or frontal sinus tenderness.     Mouth/Throat:     Pharynx: Uvula midline. No oropharyngeal exudate, posterior oropharyngeal erythema or uvula swelling.     Comments: Drainage present posterior oropharynx Cardiovascular:     Rate and Rhythm: Normal rate and regular rhythm.     Heart sounds: Normal heart sounds, S1 normal and S2 normal. No murmur heard. Pulmonary:     Effort: Pulmonary effort is normal. No accessory muscle usage or respiratory distress.     Breath sounds: Normal breath sounds. No stridor. No wheezing, rhonchi or rales.     Comments: Clear to auscultation bilaterally Neurological:     Mental Status: He is alert.  Psychiatric:        Behavior: Behavior is cooperative.     UC Treatments / Results  Labs (all labs ordered are listed, but only abnormal results are displayed) Labs Reviewed - No data to display  EKG   Radiology No results found.  Procedures Procedures (including critical care time)  Medications Ordered in UC Medications - No data to display  Initial Impression / Assessment and Plan / UC Course  I have reviewed the triage vital signs and the nursing notes.  Pertinent labs & imaging results that were  available during my care of the patient were reviewed by me and considered in my medical decision making (see chart for details).     No indication for viral testing given patient has been symptomatic for 1 week.  Recommended he complete course of antibiotics as previously prescribed.  He reports that his blood sugars are well controlled and so we will try low-dose prednisone (20 mg for 3 days) and he was instructed to avoid NSAIDs while on  this medication.  Discussed that this can cause hyperglycemia and he should avoid carbohydrates and drink plenty of fluid while on this medicine.  Recommended over-the-counter medication including sinus rinses and Mucinex for additional symptom relief.  Discussed that we do not have imaging capabilities for sinuses and if symptoms persist despite prednisone and antibiotics he may benefit from sinus CT but this would need to be arranged through a specialist.  He was given contact information for local ENT and encouraged to follow-up with them if symptoms do not improve; he is to call to schedule an appointment.  Discussed that if he has any worsening symptoms including high fever, chest pain, nausea/vomiting interfering with oral intake, shortness of breath, severe epistaxis he needs to go to the emergency room.  Strict return precautions given to which he expressed understanding.  Work excuse note provided.  Final Clinical Impressions(s) / UC Diagnoses   Final diagnoses:  Acute non-recurrent maxillary sinusitis  Nasal congestion     Discharge Instructions      Take prednisone 20 mg in the morning for 3 days.  Do not take NSAIDs including aspirin, ibuprofen/Advil, naproxen/Aleve with this medication as it can cause stomach bleeding.  This will raise your blood sugar so please avoid carbohydrates and drink lots of water.  If your blood sugars persistently above 200 please contact us or PCP.  Continue antibiotics as previously prescribed.  I recommend using Mucinex  and sinus rinses over-the-counter.  If your symptoms or not improving please contact ENT to schedule an appointment for further evaluation and management.  If you have any severe symptoms including high fever not responding to medication, chest pain, shortness of breath, severe cough, nausea/vomiting interfering with oral intake you need to go to the emergency room.     ED Prescriptions     Medication Sig Dispense Auth. Provider   predniSONE (DELTASONE) 10 MG tablet Take 2 tablets (20 mg total) by mouth daily for 3 days. 6 tablet Alois Mincer, Derry Skill, PA-C      PDMP not reviewed this encounter.   Terrilee Croak, PA-C 12/11/21 [id number]

## 2023-07-02 ENCOUNTER — Ambulatory Visit (HOSPITAL_COMMUNITY)
Admission: EM | Admit: 2023-07-02 | Discharge: 2023-07-02 | Disposition: A | Discharge status: Home / Self Care | Payer: Federal, State, Local not specified - PPO | Attending: Emergency Medicine | Admitting: Emergency Medicine

## 2023-07-02 ENCOUNTER — Encounter (HOSPITAL_COMMUNITY): Payer: Self-pay | Admitting: Emergency Medicine

## 2023-07-02 DIAGNOSIS — J309 Allergic rhinitis, unspecified: Secondary | ICD-10-CM | POA: Diagnosis not present

## 2023-07-02 DIAGNOSIS — R0981 Nasal congestion: Secondary | ICD-10-CM | POA: Diagnosis not present

## 2023-07-02 MED ORDER — FLUTICASONE PROPIONATE 50 MCG/ACT NA SUSP: NASAL | 0 refills | Status: AC

## 2023-07-02 MED ORDER — GUAIFENESIN ER 600 MG PO TB12: 1200.0000 mg | ORAL_TABLET | Freq: Two times a day (BID) | ORAL | 0 refills | Status: AC

## 2023-07-02 MED ORDER — CETIRIZINE HCL 10 MG PO TABS: 10.0000 mg | ORAL_TABLET | Freq: Every day | ORAL | 0 refills | Status: DC

## 2023-07-02 NOTE — Discharge Instructions (Signed)
Please start taking the daily antihistamine.  You can also do the nasal spray and Mucinex.  Ensure you drink at least 64 ounces of water to help loosen up your secretions.  Wearing a proper fitting mask can help diminish your exposure to allergens.  We have given you a sinus rinse kit, please rinse your sinuses out daily with filtered or distilled water.  Return to clinic if you develop fever, no improvement in symptoms over the next week, sinus pain, pressure, or any new concerning symptoms.

## 2023-07-02 NOTE — ED Provider Notes (Signed)
[facility]    CSN: [id number] Arrival date & time: 07/02/23  1456      History   Chief Complaint Chief Complaint  Patient presents with   Nasal Congestion    HPI Tyler Hines is a 47 y.o. male.   Patient presents to clinic for reports of nasal congestion, sneezing, watery eyes, headache and the occasional dry cough.  He denies any sore throat, sinus pressure, fever or recent sick contacts.  He has tried Advil cold and flu symptoms OTC.  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  The history is provided by the patient and medical records.    History reviewed. No pertinent past medical history.  There are no problems to display for this patient.   History reviewed. No pertinent surgical history.     Home Medications    Prior to Admission medications   Medication Sig Start Date End Date Taking? Authorizing Provider  cetirizine (ZYRTEC) 10 MG tablet Take 1 tablet (10 mg total) by mouth daily. 07/02/23 08/01/23 Yes Rinaldo Ratel, [place] N, FNP  guaiFENesin (MUCINEX) 600 MG 12 hr tablet Take 2 tablets (1,200 mg total) by mouth 2 (two) times daily for 5 days. 07/02/23 07/07/23 Yes Rinaldo Ratel, [place] N, FNP  diphenhydrAMINE HCl (BENADRYL PO) Take by mouth.    [provider]  fluticasone Aleda Grana) 50 MCG/ACT nasal spray One spray up to twice daily in R nostil until symptoms resolve 07/02/23   Keno Caraway, [place] N, FNP  IBUPROFEN PO Take by mouth.    [provider]    Family History Family History  Problem Relation Age of Onset   Seizures Father    Alcoholism Father     Social History Social History   Tobacco Use   Smoking status: Former    Types: Cigars   Smokeless tobacco: Never  Vaping Use   Vaping status: Never Used  Substance Use Topics   Alcohol use: Yes    Alcohol/week: 10.0  standard drinks of alcohol    Types: 10 Shots of liquor per week   Drug use: Yes    Types: Marijuana     Allergies   Patient has no known allergies.   Review of Systems Review of Systems  HENT:  Positive for congestion, postnasal drip, rhinorrhea and sneezing. Negative for sinus pressure and sinus pain.   Respiratory:  Positive for cough. Negative for shortness of breath and wheezing.   Cardiovascular:  Negative for chest pain.     Physical Exam Triage Vital Signs ED Triage Vitals  Encounter Vitals Group     BP 07/02/23 1518 123/85     Systolic BP Percentile --      Diastolic BP Percentile --      Pulse Rate 07/02/23 1518 96     Resp 07/02/23 1518 16     Temp 07/02/23 1518 98.6 F (37 C)     Temp Source 07/02/23 1518 Oral     SpO2 07/02/23 1518 97 %     Weight --      Height --      Head Circumference --      Peak Flow --      Pain Score 07/02/23 1519 5     Pain Loc --      Pain Education --      Exclude from  Growth Chart --    No data found.  Updated Vital Signs BP 123/85 (BP Location: Right Arm)   Pulse 96   Temp 98.6 F (37 C) (Oral)   Resp 16   SpO2 97%   Visual Acuity Right Eye Distance:   Left Eye Distance:   Bilateral Distance:    Right Eye Near:   Left Eye Near:    Bilateral Near:     Physical Exam Vitals and nursing note reviewed.  Constitutional:      Appearance: Normal appearance.  HENT:     Head: Normocephalic and atraumatic.     Right Ear: External ear normal.     Left Ear: External ear normal.     Nose: Congestion and rhinorrhea present.     Mouth/Throat:     Mouth: Mucous membranes are moist.  Eyes:     Conjunctiva/sclera: Conjunctivae normal.  Cardiovascular:     Rate and Rhythm: Normal rate.  Pulmonary:     Effort: Pulmonary effort is normal. No respiratory distress.  Musculoskeletal:        General: Normal range of motion.  Skin:    General: Skin is warm and dry.  Neurological:     General: No focal deficit present.      Mental Status: He is alert and oriented to person, place, and time.  Psychiatric:        Mood and Affect: Mood normal.        Behavior: Behavior normal.      UC Treatments / Results  Labs (all labs ordered are listed, but only abnormal results are displayed) Labs Reviewed - No data to display  EKG   Radiology No results found.  Procedures Procedures (including critical care time)  Medications Ordered in UC Medications - No data to display  Initial Impression / Assessment and Plan / UC Course  I have reviewed the triage vital signs and the nursing notes.  Pertinent labs & imaging results that were available during my care of the patient were reviewed by me and considered in my medical decision making (see chart for details).  Vitals and triage reviewed, patient is hemodynamically stable.  Symptoms consistent with allergic rhinitis.  Will trial antihistamine, Flonase, sinus rinses and Mucinex.  Without sinus tenderness or fever, and symptoms have only been present for 3 days, low concern for bacterial etiology at this point.  Plan of care, follow-up care return precautions given, no questions at this time.  Provided with a sinus rinse kit.     Final Clinical Impressions(s) / UC Diagnoses   Final diagnoses:  Nasal congestion  Allergic rhinitis, unspecified seasonality, unspecified trigger     Discharge Instructions      Please start taking the daily antihistamine.  You can also do the nasal spray and Mucinex.  Ensure you drink at least 64 ounces of water to help loosen up your secretions.  Wearing a proper fitting mask can help diminish your exposure to allergens.  We have given you a sinus rinse kit, please rinse your sinuses out daily with filtered or distilled water.  Return to clinic if you develop fever, no improvement in symptoms over the next week, sinus pain, pressure, or any new concerning symptoms.     ED Prescriptions     Medication Sig Dispense  Auth. Provider   fluticasone (FLONASE) 50 MCG/ACT nasal spray One spray up to twice daily in R nostil until symptoms resolve 15.8 mL Ulys Favia, [place] N, FNP   cetirizine (ZYRTEC) 10  MG tablet Take 1 tablet (10 mg total) by mouth daily. 30 tablet Rinaldo Ratel, [place] N, [place]   guaiFENesin (MUCINEX) 600 MG 12 hr tablet Take 2 tablets (1,200 mg total) by mouth 2 (two) times daily for 5 days. 20 tablet Tosca Pletz, [place] N, [place]      PDMP not reviewed this encounter.   Harue Pribble, [place] N, [place] 07/02/23 [phone]

## 2023-07-02 NOTE — ED Triage Notes (Signed)
Nasal congestion, sneezing, watery eyes, headache, occasional cough. Denies sore throat, body aches. Taking advil cold/flu.

## 2023-10-04 ENCOUNTER — Ambulatory Visit
Admission: RE | Admit: 2023-10-04 | Discharge: 2023-10-04 | Disposition: A | Discharge status: Home / Self Care | Payer: Federal, State, Local not specified - PPO | Source: Ambulatory Visit | Attending: Internal Medicine | Admitting: Internal Medicine

## 2023-10-04 ENCOUNTER — Ambulatory Visit: Payer: Federal, State, Local not specified - PPO

## 2023-10-04 VITALS — BP 133/79 | HR 94 | Temp 99.3°F | Resp 16

## 2023-10-04 DIAGNOSIS — S63681A Other sprain of right thumb, initial encounter: Secondary | ICD-10-CM

## 2023-10-04 DIAGNOSIS — M79644 Pain in right finger(s): Secondary | ICD-10-CM | POA: Diagnosis not present

## 2023-10-04 MED ORDER — IBUPROFEN 600 MG PO TABS: 600.0000 mg | ORAL_TABLET | Freq: Four times a day (QID) | ORAL | 0 refills | Status: AC | PRN
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
## 2024-04-04 ENCOUNTER — Ambulatory Visit
Admission: EM | Admit: 2024-04-04 | Discharge: 2024-04-04 | Disposition: A | Discharge status: Home / Self Care | Attending: Family Medicine | Admitting: Family Medicine

## 2024-04-04 DIAGNOSIS — H01116 Allergic dermatitis of left eye, unspecified eyelid: Secondary | ICD-10-CM | POA: Diagnosis not present

## 2024-04-04 DIAGNOSIS — H1012 Acute atopic conjunctivitis, left eye: Secondary | ICD-10-CM

## 2024-04-04 MED ORDER — CETIRIZINE HCL 10 MG PO TABS: 10.0000 mg | ORAL_TABLET | Freq: Every day | ORAL | 0 refills | Status: AC

## 2024-04-04 MED ORDER — AZELASTINE HCL 0.05 % OP SOLN: 1.0000 [drp] | Freq: Two times a day (BID) | OPHTHALMIC | 0 refills | Status: AC

## 2024-04-04 NOTE — ED Triage Notes (Signed)
 Pt present with lt eye itching and swelling x this morning. C/o cracking of skin on the eye lid. Has not done anything at home for relief.

## 2024-04-04 NOTE — Discharge Instructions (Signed)
 Start azelastine allergy eyedrops twice daily as needed.  Also start cetirizine  allergy medicine daily for the next 7 to 14 days.  Use over-the-counter Aquaphor lotion to your upper eyelids to help with the dryness.  Follow-up with your PCP if your symptoms do not improve.  Please go to the ER for any worsening symptoms.  Hope you feel better soon!

## 2024-04-04 NOTE — ED Provider Notes (Signed)
 [facility]    CSN: [id number] Arrival date & time: 04/04/24  1832      History   Chief Complaint Chief Complaint  Patient presents with   Eye Problem    HPI Tyler Hines is a 48 y.o. male presents for eye itching/swelling.  Patient reports 1 day of left eye itching/irritation with watery drainage.  Also reports some dry flaking skin to the upper eyelid.  Denies any foreign body sensation, photophobia, injury to the eye.  Does not wear glasses or contacts.  Does endorse some mild allergy symptoms otherwise no URI symptoms.  Has not used any OTC treatments for symptoms.  No other concerns at this time.   Eye Problem Associated symptoms: itching     History reviewed. No pertinent past medical history.  There are no active problems to display for this patient.   History reviewed. No pertinent surgical history.     Home Medications    Prior to Admission medications   Medication Sig Start Date End Date Taking? Authorizing Provider  azelastine (OPTIVAR) 0.05 % ophthalmic solution Place 1 drop into the left eye 2 (two) times daily. 04/04/24  Yes Timberly Yott, Jodi R, NP  cetirizine  (ZYRTEC ) 10 MG tablet Take 1 tablet (10 mg total) by mouth daily. 04/04/24  Yes Stevi Hollinshead, Jodi R, NP  diphenhydrAMINE HCl (BENADRYL PO) Take by mouth.    [provider]  fluticasone  (FLONASE ) 50 MCG/ACT nasal spray One spray up to twice daily in R nostil until symptoms resolve 07/02/23   Harlow Lighter, [place]  N, FNP  ibuprofen  (ADVIL ) 600 MG tablet Take 1 tablet (600 mg total) by mouth every 6 (six) hours as needed. 10/04/23   Adolph Hoop, PA-C    Family History Family History  Problem Relation Age of Onset   Seizures Father    Alcoholism Father     Social History Social History   Tobacco Use   Smoking status: Former    Types: Cigars   Smokeless tobacco: Never  Vaping Use   Vaping status: Never Used  Substance Use Topics   Alcohol use: Yes    Alcohol/week: 10.0 standard drinks  of alcohol    Types: 10 Shots of liquor per week   Drug use: Yes    Types: Marijuana     Allergies   Patient has no known allergies.   Review of Systems Review of Systems  Eyes:  Positive for itching.     Physical Exam Triage Vital Signs ED Triage Vitals [04/04/24 1855]  Encounter Vitals Group     BP 124/77     Systolic BP Percentile      Diastolic BP Percentile      Pulse Rate 89     Resp 16     Temp 99.7 F (37.6 C)     Temp Source Oral     SpO2 97 %     Weight      Height      Head Circumference      Peak Flow      Pain Score 0     Pain Loc      Pain Education      Exclude from Growth Chart    No data found.  Updated Vital Signs BP 124/77 (BP Location: Left Arm)   Pulse 89   Temp 99.7 F (37.6 C) (Oral)   Resp 16   SpO2 97%   Visual Acuity Right Eye Distance:   Left Eye Distance:   Bilateral Distance:  Right Eye Near:   Left Eye Near:    Bilateral Near:     Physical Exam Vitals and nursing note reviewed.  Constitutional:      General: He is not in acute distress.    Appearance: Normal appearance. He is not ill-appearing.  HENT:     Head: Normocephalic and atraumatic.  Eyes:     General:        Left eye: No foreign body or hordeolum.     Extraocular Movements: Extraocular movements intact.     Conjunctiva/sclera:     Right eye: Right conjunctiva is not injected. No chemosis, exudate or hemorrhage.    Left eye: Left conjunctiva is injected. No chemosis, exudate or hemorrhage.    Pupils: Pupils are equal, round, and reactive to light.     Left eye: No corneal abrasion or fluorescein uptake.     Comments: Mildly injected left conjunctiva.  While dry flaking skin to left upper eyelid.  No periorbital swelling, warmth, erythema, drainage  Cardiovascular:     Rate and Rhythm: Normal rate.  Pulmonary:     Effort: Pulmonary effort is normal.  Skin:    General: Skin is warm and dry.  Neurological:     General: No focal deficit present.      Mental Status: He is alert and oriented to person, place, and time.  Psychiatric:        Mood and Affect: Mood normal.        Behavior: Behavior normal.      UC Treatments / Results  Labs (all labs ordered are listed, but only abnormal results are displayed) Labs Reviewed - No data to display  EKG   Radiology No results found.  Procedures Procedures (including critical care time)  Medications Ordered in UC Medications - No data to display  Initial Impression / Assessment and Plan / UC Course  I have reviewed the triage vital signs and the nursing notes.  Pertinent labs & imaging results that were available during my care of the patient were reviewed by me and considered in my medical decision making (see chart for details).     Reviewed exam and symptoms with patient.  No red flags.  Discussed allergic conjunctivitis/allergic eyelid dermatitis.  Will do trial of allergy eyedrops and cetirizine .  Discussed Aquaphor lotion OTC to upper eyelids for hydration.  PCP follow-up if symptoms do not improve.  ER precautions reviewed and patient verbalized understanding. Final Clinical Impressions(s) / UC Diagnoses   Final diagnoses:  Allergic conjunctivitis of left eye  Allergic dermatitis eyelid, left     Discharge Instructions      Start azelastine allergy eyedrops twice daily as needed.  Also start cetirizine  allergy medicine daily for the next 7 to 14 days.  Use over-the-counter Aquaphor lotion to your upper eyelids to help with the dryness.  Follow-up with your PCP if your symptoms do not improve.  Please go to the ER for any worsening symptoms.  Hope you feel better soon!  ED Prescriptions     Medication Sig Dispense Auth. Provider   azelastine (OPTIVAR) 0.05 % ophthalmic solution Place 1 drop into the left eye 2 (two) times daily. 6 mL Jasmeet Gehl, Jodi R, NP   cetirizine  (ZYRTEC ) 10 MG tablet Take 1 tablet (10 mg total) by mouth daily. 30 tablet Alaysiah Browder, Jodi R, NP       PDMP not reviewed this encounter.   Alleen Arbour, NP 04/04/24 1911
